# Patient Record
Sex: Male | Born: 1965
Health system: Southern US, Community
[De-identification: ages and names within clinical notes are randomized; demographics above are authoritative.]

## PROBLEM LIST (undated history)

## (undated) DIAGNOSIS — B009 Herpesviral infection, unspecified: Secondary | ICD-10-CM

## (undated) DIAGNOSIS — I10 Essential (primary) hypertension: Secondary | ICD-10-CM

## (undated) HISTORY — DX: Herpesviral infection, unspecified: B00.9

## (undated) HISTORY — PX: WISDOM TOOTH EXTRACTION: SHX21

---

## 1993-06-13 HISTORY — PX: LAPAROSCOPIC INGUINAL HERNIA REPAIR: SUR788

## 1993-06-13 HISTORY — PX: HERNIA REPAIR: SHX51

## 2010-02-11 ENCOUNTER — Encounter: Admission: RE | Admit: 2010-02-11 | Discharge: 2010-02-11 | Payer: Self-pay | Admitting: Internal Medicine

## 2011-11-15 ENCOUNTER — Emergency Department (HOSPITAL_COMMUNITY)
Admission: EM | Admit: 2011-11-15 | Discharge: 2011-11-15 | Disposition: A | Discharge status: Home / Self Care | Payer: Self-pay | Source: Home / Self Care | Attending: Emergency Medicine | Admitting: Emergency Medicine

## 2011-11-15 ENCOUNTER — Encounter (HOSPITAL_COMMUNITY): Payer: Self-pay | Admitting: Emergency Medicine

## 2011-11-15 DIAGNOSIS — L237 Allergic contact dermatitis due to plants, except food: Secondary | ICD-10-CM

## 2011-11-15 DIAGNOSIS — L255 Unspecified contact dermatitis due to plants, except food: Secondary | ICD-10-CM

## 2011-11-15 HISTORY — DX: Essential (primary) hypertension: I10

## 2011-11-15 MED ORDER — TRIAMCINOLONE ACETONIDE 0.1 % EX CREA: TOPICAL_CREAM | Freq: Three times a day (TID) | CUTANEOUS | Status: DC

## 2011-11-15 MED ORDER — PREDNISONE 10 MG PO TABS: ORAL_TABLET | ORAL | Status: DC

## 2011-11-15 MED ORDER — METHYLPREDNISOLONE ACETATE 80 MG/ML IJ SUSP
80.0000 mg | Freq: Once | INTRAMUSCULAR | Status: AC
  Administered 2011-11-15: 80 mg via INTRAMUSCULAR

## 2011-11-15 MED ORDER — METHYLPREDNISOLONE ACETATE 80 MG/ML IJ SUSP
INTRAMUSCULAR | Status: AC
  Filled 2011-11-15: qty 1

## 2011-11-15 NOTE — ED Notes (Signed)
Has had out break of poison oak/ivy the past week. Large area on left forearm. Smaller outbreaks on right arm and abdomen.

## 2011-11-15 NOTE — ED Provider Notes (Signed)
Chief Complaint  Patient presents with  . Poison Oak    History of Present Illness:   The patient is a 46 year old male who has had a seven-day history of a severe poison ivy outbreak and both of his arms and his trunk. 3 HTN blistered. He's had no fever or chills no difficulty breathing.  Review of Systems:  Other than noted above, the patient denies any of the following symptoms: Systemic:  No fever, chills, sweats, weight loss, or fatigue. ENT:  No nasal congestion, rhinorrhea, sore throat, swelling of lips, tongue or throat. Resp:  No cough, wheezing, or shortness of breath. Skin:  No rash, itching, nodules, or suspicious lesions.  PMFSH:  Past medical history, family history, social history, meds, and allergies were reviewed.  Physical Exam:   Vital signs:  BP 146/80  Pulse 68  Temp(Src) 97.9 F (36.6 C) (Oral)  Resp 16  SpO2 99% Gen:  Alert, oriented, in no distress. ENT:  Pharynx clear, no intraoral lesions, moist mucous membranes. Lungs:  Clear to auscultation. Skin:  He has severe blistering of both of his arms and on his trunk as well.  Course in Urgent Care Center:   He was given Depo-Medrol 80 mg IM.  Assessment:  The encounter diagnosis was Poison ivy.  Plan:   1.  The following meds were prescribed:   New Prescriptions   PREDNISONE (DELTASONE) 10 MG TABLET    Take 4 tabs daily for 4 days, 3 tabs daily for 4 days, 2 tabs daily for 4 days, then 1 tab daily for 4 days.   TRIAMCINOLONE CREAM (KENALOG) 0.1 %    Apply topically 3 (three) times daily.   2.  The patient was instructed in symptomatic care and handouts were given. 3.  The patient was told to return if becoming worse in any way, if no better in 3 or 4 days, and given some red flag symptoms that would indicate earlier return.     Reuben Likes, MD 11/15/11 [phone]

## 2011-11-15 NOTE — Discharge Instructions (Signed)
Use DomeBurrow solution soaks 4 times daily.Poison [employer] ivy is a inflammation of the skin (contact dermatitis) caused by touching the allergens on the leaves of the ivy plant following previous exposure to the plant. The rash usually appears 48 hours after exposure. The rash is usually bumps (papules) or blisters (vesicles) in a linear pattern. Depending on your own sensitivity, the rash may simply cause redness and itching, or it may also progress to blisters which may break open. These must be well cared for to prevent secondary bacterial (germ) infection, followed by scarring. Keep any open areas dry, clean, dressed, and covered with an antibacterial ointment if needed. The eyes may also get puffy. The puffiness is worst in the morning and gets better as the day progresses. This dermatitis usually heals without scarring, within 2 to 3 weeks without treatment. HOME CARE INSTRUCTIONS  Thoroughly wash with soap and water as soon as you have been exposed to poison ivy. You have about one half hour to remove the plant resin before it will cause the rash. This washing will destroy the oil or antigen on the skin that is causing, or will cause, the rash. Be sure to wash under your fingernails as any plant resin there will continue to spread the rash. Do not rub skin vigorously when washing affected area. Poison ivy cannot spread if no oil from the plant remains on your body. A rash that has progressed to weeping sores will not spread the rash unless you have not washed thoroughly. It is also important to wash any clothes you have been wearing as these may carry active allergens. The rash will return if you wear the unwashed clothing, even several days later. Avoidance of the plant in the future is the best measure. Poison ivy plant can be recognized by the number of leaves. Generally, poison ivy has three leaves with flowering branches on a single stem. Diphenhydramine may be purchased over the counter and  used as needed for itching. Do not drive with this medication if it makes you drowsy.Ask your caregiver about medication for children. SEEK MEDICAL CARE IF:  Open sores develop.   Redness spreads beyond area of rash.   You notice purulent (pus-like) discharge.   You have increased pain.   Other signs of infection develop (such as fever).  Document Released: 05/27/2000 Document Revised: 05/19/2011 Document Reviewed: 04/15/2009 [facility] Patient Information 2012 [place], [place].

## 2013-12-09 ENCOUNTER — Telehealth: Payer: Self-pay | Admitting: *Deleted

## 2013-12-09 NOTE — Telephone Encounter (Signed)
Left message on voice mail for the patient to return my call regarding upcoming appt.

## 2013-12-09 NOTE — Telephone Encounter (Signed)
Medication List and allergies: Reviewed and updated  90 Day supply/mail order:  Local prescriptions: [facility], 90 day  Immunizations Due: PNA  A/P FH, PSH, or Personal Hx: Reviewed and updated Flu vaccine: Tdap: 09/2011 PNA: Never Shingles: 09/2011 CCS: 04/2013 normal--> 5 years PSA: 11/2012 1.32  To Discuss with Provider: Wants to know if he needs to continue B/P med

## 2013-12-10 ENCOUNTER — Ambulatory Visit (HOSPITAL_BASED_OUTPATIENT_CLINIC_OR_DEPARTMENT_OTHER)
Admission: RE | Admit: 2013-12-10 | Discharge: 2013-12-10 | Disposition: A | Discharge status: Home / Self Care | Payer: BC Managed Care – PPO | Source: Ambulatory Visit | Attending: Internal Medicine | Admitting: Internal Medicine

## 2013-12-10 ENCOUNTER — Encounter: Payer: Self-pay | Admitting: Internal Medicine

## 2013-12-10 ENCOUNTER — Ambulatory Visit (INDEPENDENT_AMBULATORY_CARE_PROVIDER_SITE_OTHER): Payer: BC Managed Care – PPO | Admitting: Internal Medicine

## 2013-12-10 VITALS — BP 130/84 | HR 100 | Temp 98.2°F | Ht 72.0 in | Wt 185.0 lb

## 2013-12-10 DIAGNOSIS — E162 Hypoglycemia, unspecified: Secondary | ICD-10-CM

## 2013-12-10 DIAGNOSIS — I10 Essential (primary) hypertension: Secondary | ICD-10-CM

## 2013-12-10 DIAGNOSIS — M21612 Bunion of left foot: Secondary | ICD-10-CM

## 2013-12-10 DIAGNOSIS — R079 Chest pain, unspecified: Secondary | ICD-10-CM

## 2013-12-10 DIAGNOSIS — M21619 Bunion of unspecified foot: Secondary | ICD-10-CM | POA: Insufficient documentation

## 2013-12-10 DIAGNOSIS — R0781 Pleurodynia: Secondary | ICD-10-CM | POA: Insufficient documentation

## 2013-12-10 DIAGNOSIS — B009 Herpesviral infection, unspecified: Secondary | ICD-10-CM | POA: Insufficient documentation

## 2013-12-10 DIAGNOSIS — R5381 Other malaise: Secondary | ICD-10-CM

## 2013-12-10 DIAGNOSIS — R5383 Other fatigue: Secondary | ICD-10-CM | POA: Insufficient documentation

## 2013-12-10 DIAGNOSIS — R188 Other ascites: Secondary | ICD-10-CM | POA: Insufficient documentation

## 2013-12-10 LAB — COMPREHENSIVE METABOLIC PANEL
ALT: 47 U/L (ref 0–53)
AST: 37 U/L (ref 0–37)
Albumin: 4 g/dL (ref 3.5–5.2)
Alkaline Phosphatase: 45 U/L (ref 39–117)
BILIRUBIN TOTAL: 0.6 mg/dL (ref 0.2–1.2)
BUN: 13 mg/dL (ref 6–23)
CHLORIDE: 102 meq/L (ref 96–112)
CO2: 27 meq/L (ref 19–32)
Calcium: 9.3 mg/dL (ref 8.4–10.5)
Creatinine, Ser: 0.9 mg/dL (ref 0.4–1.5)
GFR: 100.32 mL/min (ref >60.00)
Glucose, Bld: 171 mg/dL — ABNORMAL HIGH (ref 70–99)
POTASSIUM: 3.4 meq/L — AB (ref 3.5–5.1)
SODIUM: 137 meq/L (ref 135–145)
TOTAL PROTEIN: 7.2 g/dL (ref 6.0–8.3)

## 2013-12-10 LAB — CBC WITH DIFFERENTIAL/PLATELET
BASOS ABS: 0 10*3/uL (ref 0.0–0.1)
Basophils Relative: 0.3 % (ref 0.0–3.0)
Eosinophils Absolute: 0.5 10*3/uL (ref 0.0–0.7)
Eosinophils Relative: 4.8 % (ref 0.0–5.0)
HEMATOCRIT: 44.5 % (ref 39.0–52.0)
Hemoglobin: 15.4 g/dL (ref 13.0–17.0)
LYMPHS ABS: 1.8 10*3/uL (ref 0.7–4.0)
Lymphocytes Relative: 16 % (ref 12.0–46.0)
MCHC: 34.7 g/dL (ref 30.0–36.0)
MCV: 91.8 fl (ref 78.0–100.0)
MONO ABS: 0.7 10*3/uL (ref 0.1–1.0)
MONOS PCT: 6.5 % (ref 3.0–12.0)
Neutro Abs: 8 10*3/uL — ABNORMAL HIGH (ref 1.4–7.7)
Neutrophils Relative %: 72.4 % (ref 43.0–77.0)
PLATELETS: 198 10*3/uL (ref 150.0–400.0)
RBC: 4.85 Mil/uL (ref 4.22–5.81)
RDW: 12.7 % (ref 11.5–15.5)
WBC: 11.1 10*3/uL — ABNORMAL HIGH (ref 4.0–10.5)

## 2013-12-10 LAB — VITAMIN B12: Vitamin B-12: 266 pg/mL (ref 211–911)

## 2013-12-10 LAB — TSH: TSH: 1.03 u[IU]/mL (ref 0.35–4.50)

## 2013-12-10 LAB — FOLATE: Folate: 14.6 ng/mL (ref >5.9)

## 2013-12-10 MED ORDER — AMLODIPINE BESYLATE 10 MG PO TABS: 10.0000 mg | ORAL_TABLET | Freq: Every day | ORAL | Status: DC

## 2013-12-10 MED ORDER — VALACYCLOVIR HCL 500 MG PO TABS: 500.0000 mg | ORAL_TABLET | Freq: Two times a day (BID) | ORAL | Status: DC | PRN

## 2013-12-10 MED ORDER — VALACYCLOVIR HCL 500 MG PO TABS
500.0000 mg | ORAL_TABLET | Freq: Two times a day (BID) | ORAL | Status: DC | PRN
Start: 2013-12-10 — End: 2013-12-10

## 2013-12-10 NOTE — Progress Notes (Signed)
Pre visit review using our clinic review tool, if applicable. No additional management support is needed unless otherwise documented below in the visit note. 

## 2013-12-10 NOTE — Patient Instructions (Signed)
Get your blood work before you leave   SIGN A RELEASE OF INFORMATION TO OBTAIN RECORDS FROM YOUR PREVIOUS DOCTOR (@ THE FRONT DESK)  Get the XR at [facility], corner of [place] and [address] (10 minutes form here); they are open 24/7 [address]  [place], [place] [address] [phone]  Next visit is for a physical exam in 6 months   fasting Please make an appointment     At some point this year the clinic will relocate to  [facility],  corner of [place] and [address] (10 minutes form here)  [place]  [place], [place] [address] [phone]

## 2013-12-10 NOTE — Assessment & Plan Note (Signed)
3 months history of pain at the distal left rib cage, pain sounds mechanical, he does have an asymmetric rib cage. Plan: Chest x-ray Reassess on RTC

## 2013-12-10 NOTE — Assessment & Plan Note (Signed)
Good compliance of medication, BP today is normal. Plan: No change, labs,Get records from previous M.D.

## 2013-12-10 NOTE — Progress Notes (Signed)
Subjective:    Patient ID: Larry Burgess, male    DOB: 1966/01/18, 48 y.o.   MRN: [id number]  DOS:  12/10/2013 Type of  Visit: new patient , moved  from [place],  [place] 3 years ago,  used to see the doctors over there. Needs a new PCP History, Needs to discuss several issues -- several months history of lack of energy, described as generalized fatigue, feeling unmotivated. -- 3 months history of pain at the distal left rib cage, the pain is on and off, described as a soreness, not sharp, increase depending on his posture, quickly corrects if he changed his position. --Also complained of pain at the left second toe, has noted some deformities there.  ROS Denies fever, chills. No weight loss No chest pain, dyspnea on exertion, palpitations. Denies nausea, vomiting, diarrhea or blood in the stools. Very rarely has GERD symptoms. No depression or anxiety. + Snoring but not really feeling sleepy throughout the day. Denies problems with decreased libido or ED  Past Medical History  Diagnosis Date  . Hypertension   . Herpes     Past Surgical History  Procedure Laterality Date  . Hernia repair  1995    History   Social History  . Marital Status: Married    Spouse Name: N/A    Number of Children: 0  . Years of Education: N/A   Occupational History  . engineer     Social History Main Topics  . Smoking status: Never Smoker   . Smokeless tobacco: Not on file  . Alcohol Use: Yes     Comment: occasionally  . Drug Use:   . Sexual Activity:    Other Topics Concern  . Not on file   Social History Narrative  . No narrative on file     Family History  Problem Relation Age of Onset  . CAD Neg Hx   . Stroke Neg Hx   . Colon cancer Neg Hx   . Prostate cancer Neg Hx        Medication List       This list is accurate as of: 12/10/13  6:17 PM.  Always use your most recent med list.               amLODipine 10 MG tablet  Commonly known as:  NORVASC  Take 1  tablet (10 mg total) by mouth daily.     valACYclovir 500 MG tablet  Commonly known as:  VALTREX  Take 1 tablet (500 mg total) by mouth 2 (two) times daily as needed.           Objective:   Physical Exam BP 130/84  Pulse 100  Temp(Src) 98.2 F (36.8 C)  Ht 6' (1.829 m)  Wt 185 lb (83.915 kg)  BMI 25.08 kg/m2  SpO2 99%  General -- alert, well-developed, healthy appearing, NAD.  Neck --no thyromegaly   HEENT-- Not pale. Lungs -- normal respiratory effort, no intercostal retractions, no accessory muscle use, and normal breath sounds.  Heart-- normal rate, regular rhythm, no murmur.  Chest wall, rib cage not tender to palpation, the left side is more prominent, patient reports he has been that way since he can't recall. Abdomen-- Not distended, good bowel sounds,soft, non-tender. No HSM Extremities-- no pretibial edema bilaterally ; Left foot : + bunion, second toe bending medially Neurologic--  alert & oriented X3. Speech normal, gait appropriate for age, strength symmetric and appropriate for age.  Psych-- Cognition and judgment  appear intact. Cooperative with normal attention span and concentration. No anxious or depressed appearing.       Assessment & Plan:   Today , I spent 34    min with the patient: >50% of the time counseling regards multiple issues, multiple questions about every  issue answered to  the best of my ability       F2FWith the patient more than 50 minutes discussing multiple issues

## 2013-12-10 NOTE — Assessment & Plan Note (Signed)
Recurrent genital herpes on Valtrex as needed, prescription sent

## 2013-12-10 NOTE — Assessment & Plan Note (Signed)
Concerned about the deformity of the second left toe and bunion.  Does not like to see a foot doctor or orthopedic doctor as he wouldn't desire any surgery.  Recommend observation for now, if he desires a  noninvasive treatment he will call for a sports medicine referral, special shoes may be needed at some point

## 2013-12-10 NOTE — Assessment & Plan Note (Signed)
Lack of energy for several months, physical exam is normal, review of systems is negative for depression or other symptoms. Plan: We'll start with basic labs, reassess in 6 months, he does snore, consider a sleep study

## 2013-12-11 ENCOUNTER — Other Ambulatory Visit (INDEPENDENT_AMBULATORY_CARE_PROVIDER_SITE_OTHER): Payer: BC Managed Care – PPO

## 2013-12-11 ENCOUNTER — Encounter: Payer: Self-pay | Admitting: *Deleted

## 2013-12-11 ENCOUNTER — Telehealth: Payer: Self-pay | Admitting: Internal Medicine

## 2013-12-11 DIAGNOSIS — E162 Hypoglycemia, unspecified: Secondary | ICD-10-CM

## 2013-12-11 LAB — HEMOGLOBIN A1C: HEMOGLOBIN A1C: 5.4 % (ref 4.6–6.5)

## 2013-12-11 NOTE — Addendum Note (Signed)
Addended by: Peggyann Shoals on: 12/11/2013 08:21 AM   Modules accepted: Orders

## 2013-12-11 NOTE — Telephone Encounter (Signed)
Relevant patient education mailed to patient.  

## 2014-02-16 ENCOUNTER — Telehealth: Payer: Self-pay | Admitting: Internal Medicine

## 2014-02-16 NOTE — Telephone Encounter (Signed)
A number of handwritten records reviewed from previous PCP. At some point he was on Zestoretic, several years ago was switch to Norvasc. In 2013 she complained of "tongue swelling", at that time he was not taking ACEi. History of high triglycerides, at some point more than 500. 2013, labs: Potassium 4.2, creatinine 0.79 Total cholesterol 184, triglycerides 247, LDL 92 Only a few relevant records will be scan

## 2014-03-05 ENCOUNTER — Telehealth: Payer: Self-pay | Admitting: Internal Medicine

## 2014-03-05 MED ORDER — VALACYCLOVIR HCL 500 MG PO TABS: 500.0000 mg | ORAL_TABLET | Freq: Two times a day (BID) | ORAL | Status: DC | PRN

## 2014-03-05 MED ORDER — AMLODIPINE BESYLATE 10 MG PO TABS: 10.0000 mg | ORAL_TABLET | Freq: Every day | ORAL | Status: DC

## 2014-03-05 NOTE — Telephone Encounter (Signed)
[facility], [facility] him that they faxed the form to dr. Larose Kells but have not yet heard anything, pt needs new rx amLODipine (NORVASC) 10 MG tablet and valacyclovir (valtrex) 500 mg [facility].

## 2014-03-05 NOTE — Telephone Encounter (Signed)
Amlodipine and Valtrex filled to [facility].

## 2014-03-06 NOTE — Telephone Encounter (Signed)
Error/gd °

## 2014-06-11 ENCOUNTER — Ambulatory Visit (INDEPENDENT_AMBULATORY_CARE_PROVIDER_SITE_OTHER): Payer: BC Managed Care – PPO | Admitting: Internal Medicine

## 2014-06-11 ENCOUNTER — Encounter: Payer: Self-pay | Admitting: Internal Medicine

## 2014-06-11 VITALS — BP 138/88 | HR 73 | Temp 98.1°F | Ht 72.0 in | Wt 186.5 lb

## 2014-06-11 DIAGNOSIS — B009 Herpesviral infection, unspecified: Secondary | ICD-10-CM

## 2014-06-11 DIAGNOSIS — Z0000 Encounter for general adult medical examination without abnormal findings: Secondary | ICD-10-CM | POA: Insufficient documentation

## 2014-06-11 LAB — LIPID PANEL
Cholesterol: 179 mg/dL (ref 0–200)
HDL: 40 mg/dL (ref >39.00)
LDL Cholesterol: 105 mg/dL — ABNORMAL HIGH (ref 0–99)
NONHDL: 139
Total CHOL/HDL Ratio: 4
Triglycerides: 172 mg/dL — ABNORMAL HIGH (ref 0.0–149.0)
VLDL: 34.4 mg/dL (ref 0.0–40.0)

## 2014-06-11 LAB — BASIC METABOLIC PANEL
BUN: 8 mg/dL (ref 6–23)
CALCIUM: 9 mg/dL (ref 8.4–10.5)
CHLORIDE: 105 meq/L (ref 96–112)
CO2: 30 meq/L (ref 19–32)
CREATININE: 0.9 mg/dL (ref 0.4–1.5)
GFR: 100.11 mL/min (ref >60.00)
Glucose, Bld: 87 mg/dL (ref 70–99)
Potassium: 4.1 mEq/L (ref 3.5–5.1)
SODIUM: 138 meq/L (ref 135–145)

## 2014-06-11 LAB — CBC WITH DIFFERENTIAL/PLATELET
BASOS PCT: 0.6 % (ref 0.0–3.0)
Basophils Absolute: 0 10*3/uL (ref 0.0–0.1)
Eosinophils Absolute: 0.4 10*3/uL (ref 0.0–0.7)
Eosinophils Relative: 7.3 % — ABNORMAL HIGH (ref 0.0–5.0)
HEMATOCRIT: 45.9 % (ref 39.0–52.0)
Hemoglobin: 15.4 g/dL (ref 13.0–17.0)
Lymphocytes Relative: 27.2 % (ref 12.0–46.0)
Lymphs Abs: 1.6 10*3/uL (ref 0.7–4.0)
MCHC: 33.6 g/dL (ref 30.0–36.0)
MCV: 93.3 fl (ref 78.0–100.0)
MONOS PCT: 8.2 % (ref 3.0–12.0)
Monocytes Absolute: 0.5 10*3/uL (ref 0.1–1.0)
NEUTROS ABS: 3.2 10*3/uL (ref 1.4–7.7)
NEUTROS PCT: 56.7 % (ref 43.0–77.0)
Platelets: 182 10*3/uL (ref 150.0–400.0)
RBC: 4.92 Mil/uL (ref 4.22–5.81)
RDW: 12.3 % (ref 11.5–15.5)
WBC: 5.7 10*3/uL (ref 4.0–10.5)

## 2014-06-11 MED ORDER — VALACYCLOVIR HCL 500 MG PO TABS: 500.0000 mg | ORAL_TABLET | Freq: Two times a day (BID) | ORAL | Status: DC | PRN

## 2014-06-11 MED ORDER — AMLODIPINE BESYLATE 10 MG PO TABS: 10.0000 mg | ORAL_TABLET | Freq: Every day | ORAL | Status: DC

## 2014-06-11 NOTE — Assessment & Plan Note (Signed)
Currently on Valtrex 1 tab  daily

## 2014-06-11 NOTE — Patient Instructions (Signed)
Get your blood work before you leave   Check the  blood pressure 2   times a month   Be sure your blood pressure is between  145/85  and 110/65.  if it is consistently higher or lower, let me know   Please come back to the office in 1 year  for a physical exam. Come back fasting

## 2014-06-11 NOTE — Progress Notes (Signed)
Pre visit review using our clinic review tool, if applicable. No additional management support is needed unless otherwise documented below in the visit note. 

## 2014-06-11 NOTE — Assessment & Plan Note (Addendum)
Td 2010 per pt Declined a flu shot Never had a cscope  Diet-exercise discussed Labs  EKG-- nsr

## 2014-06-11 NOTE — Progress Notes (Signed)
   Subjective:    Patient ID: Council Munguia, male    DOB: [birth year], 48 y.o.   MRN: [id number]  DOS:  06/11/2014 Type of visit - description : cpx Interval history: no concerns    ROS Denies CP-SOB occ has episodes of nausea (random), no vomiting, diarrhea, blood in the stools or abd pain Denies anxiety or depression. No dysuria, gross hematuria or difficulty urinating. Urinary stream is slightly "weaker" compared 2 years ago.   Past Medical History  Diagnosis Date  . Hypertension   . Herpes     Past Surgical History  Procedure Laterality Date  . Hernia repair  1995    History   Social History  . Marital Status: Married    Spouse Name: N/A    Number of Children: 0  . Years of Education: N/A   Occupational History  . engineer     Social History Main Topics  . Smoking status: Never Smoker   . Smokeless tobacco: Never Used  . Alcohol Use: 0.0 oz/week    0 Not specified per week     Comment: occasionally  . Drug Use: Not on file  . Sexual Activity: Not on file   Other Topics Concern  . Not on file   Social History Narrative   Original from [place], moved to [place] at age 40   Household- pt and wife     Family History  Problem Relation Age of Onset  . CAD Neg Hx   . Stroke Neg Hx   . Colon cancer Neg Hx   . Prostate cancer Neg Hx        Medication List       This list is accurate as of: 06/11/14 11:59 PM.  Always use your most recent med list.               amLODipine 10 MG tablet  Commonly known as:  NORVASC  Take 1 tablet (10 mg total) by mouth daily.     valACYclovir 500 MG tablet  Commonly known as:  VALTREX  Take 1 tablet (500 mg total) by mouth 2 (two) times daily as needed.           Objective:   Physical Exam BP 138/88 mmHg  Pulse 73  Temp(Src) 98.1 F (36.7 C) (Oral)  Ht 6' (1.829 m)  Wt 186 lb 8 oz (84.596 kg)  BMI 25.29 kg/m2  SpO2 97%  General -- alert, well-developed, NAD.  Neck --no thyromegaly   HEENT-- Not pale    Lungs -- normal respiratory effort, no intercostal retractions, no accessory muscle use, and normal breath sounds.  Heart-- normal rate, regular rhythm, no murmur.  Abdomen-- Not distended, good bowel sounds,soft, non-tender. Extremities-- no pretibial edema bilaterally  Neurologic--  alert & oriented X3. Speech normal, gait appropriate for age, strength symmetric and appropriate for age.  Psych-- Cognition and judgment appear intact. Cooperative with normal attention span and concentration. No anxious or depressed appearing.      Assessment & Plan:

## 2015-04-07 ENCOUNTER — Telehealth: Payer: Self-pay | Admitting: *Deleted

## 2015-04-07 NOTE — Telephone Encounter (Signed)
Received order for compression socks via fax from [facility]. Forwarded to Dr. Larose Kells. JG//CMA

## 2015-06-14 HISTORY — PX: COLONOSCOPY: SHX174

## 2015-06-16 ENCOUNTER — Telehealth: Payer: Self-pay

## 2015-06-16 DIAGNOSIS — Z1211 Encounter for screening for malignant neoplasm of colon: Secondary | ICD-10-CM

## 2015-06-16 NOTE — Telephone Encounter (Signed)
Medication: Review, verify sig & reconcile(including outside meds): Completed Duplicates discarded: n/a DM supply source: n/a   Preferred Pharmacy and which med where: 90 day supply/mail order:  [place] [place] [facility]: [id number] [place], [place]  Allergies verified: Completed. No changes.   Immunization Status: Prompted for insurance verification: Yes Flu vaccine--Declined.  Postponed.   Tdap--2010 per patient.  Updated under Historical Immunizations.    A/P:   Changes to FH, PSH or Personal Hx:  Reviewed.  No updates.   CCS: Never had a colonoscopy; open to scheduling.  GI referral---pending  PSA: Not noted in chart HIV Screening-----DUE   Care Teams Updated:  No changes.   ED/Hospital/Urgent Care Visits: N/a Prompted for: Updated insurance, contact information, forms:  Yes; states insurance is the same.   Remind to bring: DPR information, advance directives: Reviewed.    To Discuss with Provider:  Mentioned back pain, but states not sure if there is anything Dr. Larose Kells can do about that.

## 2015-06-17 ENCOUNTER — Encounter: Payer: Self-pay | Admitting: Internal Medicine

## 2015-06-17 ENCOUNTER — Ambulatory Visit (INDEPENDENT_AMBULATORY_CARE_PROVIDER_SITE_OTHER): Payer: BLUE CROSS/BLUE SHIELD | Admitting: Internal Medicine

## 2015-06-17 VITALS — BP 122/78 | HR 73 | Temp 97.8°F | Ht 72.0 in | Wt 189.4 lb

## 2015-06-17 DIAGNOSIS — R6882 Decreased libido: Secondary | ICD-10-CM

## 2015-06-17 DIAGNOSIS — Z114 Encounter for screening for human immunodeficiency virus [HIV]: Secondary | ICD-10-CM | POA: Diagnosis not present

## 2015-06-17 DIAGNOSIS — Z1211 Encounter for screening for malignant neoplasm of colon: Secondary | ICD-10-CM

## 2015-06-17 DIAGNOSIS — Z0000 Encounter for general adult medical examination without abnormal findings: Secondary | ICD-10-CM | POA: Diagnosis not present

## 2015-06-17 DIAGNOSIS — R5383 Other fatigue: Secondary | ICD-10-CM | POA: Diagnosis not present

## 2015-06-17 DIAGNOSIS — Z125 Encounter for screening for malignant neoplasm of prostate: Secondary | ICD-10-CM | POA: Diagnosis not present

## 2015-06-17 LAB — CBC WITH DIFFERENTIAL/PLATELET
BASOS PCT: 0.5 % (ref 0.0–3.0)
Basophils Absolute: 0 10*3/uL (ref 0.0–0.1)
EOS PCT: 8.8 % — AB (ref 0.0–5.0)
Eosinophils Absolute: 0.6 10*3/uL (ref 0.0–0.7)
HCT: 48.4 % (ref 39.0–52.0)
HEMOGLOBIN: 16.4 g/dL (ref 13.0–17.0)
LYMPHS ABS: 1.7 10*3/uL (ref 0.7–4.0)
Lymphocytes Relative: 25.7 % (ref 12.0–46.0)
MCHC: 33.9 g/dL (ref 30.0–36.0)
MCV: 93.3 fl (ref 78.0–100.0)
MONOS PCT: 9.4 % (ref 3.0–12.0)
Monocytes Absolute: 0.6 10*3/uL (ref 0.1–1.0)
NEUTROS PCT: 55.6 % (ref 43.0–77.0)
Neutro Abs: 3.7 10*3/uL (ref 1.4–7.7)
Platelets: 196 10*3/uL (ref 150.0–400.0)
RBC: 5.19 Mil/uL (ref 4.22–5.81)
RDW: 12.6 % (ref 11.5–15.5)
WBC: 6.7 10*3/uL (ref 4.0–10.5)

## 2015-06-17 LAB — COMPREHENSIVE METABOLIC PANEL
ALBUMIN: 4.4 g/dL (ref 3.5–5.2)
ALK PHOS: 52 U/L (ref 39–117)
ALT: 32 U/L (ref 0–53)
AST: 26 U/L (ref 0–37)
BILIRUBIN TOTAL: 0.5 mg/dL (ref 0.2–1.2)
BUN: 10 mg/dL (ref 6–23)
CALCIUM: 9.8 mg/dL (ref 8.4–10.5)
CO2: 27 meq/L (ref 19–32)
CREATININE: 0.85 mg/dL (ref 0.40–1.50)
Chloride: 104 mEq/L (ref 96–112)
GFR: 101.06 mL/min (ref >60.00)
Glucose, Bld: 92 mg/dL (ref 70–99)
Potassium: 4.5 mEq/L (ref 3.5–5.1)
Sodium: 141 mEq/L (ref 135–145)
TOTAL PROTEIN: 6.9 g/dL (ref 6.0–8.3)

## 2015-06-17 LAB — LIPID PANEL
Cholesterol: 203 mg/dL — ABNORMAL HIGH (ref 0–200)
HDL: 41.9 mg/dL (ref >39.00)
NONHDL: 160.7
TRIGLYCERIDES: 247 mg/dL — AB (ref 0.0–149.0)
Total CHOL/HDL Ratio: 5
VLDL: 49.4 mg/dL — ABNORMAL HIGH (ref 0.0–40.0)

## 2015-06-17 LAB — FOLATE: FOLATE: 13.3 ng/mL (ref >5.9)

## 2015-06-17 LAB — TSH: TSH: 3.97 u[IU]/mL (ref 0.35–4.50)

## 2015-06-17 LAB — PSA: PSA: 0.98 ng/mL (ref 0.10–4.00)

## 2015-06-17 LAB — LDL CHOLESTEROL, DIRECT: Direct LDL: 112 mg/dL

## 2015-06-17 LAB — VITAMIN B12: VITAMIN B 12: 293 pg/mL (ref 211–911)

## 2015-06-17 NOTE — Progress Notes (Signed)
Subjective:    Patient ID: Larry Burgess, male    DOB: 04-Dec-1965, 50 y.o.   MRN: [id number]  DOS:  06/17/2015 Type of visit - description : CPX Interval history: Good compliance with medications   Review of Systems Constitutional:  No fever. No chills. No unexplained wt changes. No unusual sweats. Patient reports that his wife has noted occasional decrease in  energy level. Patient denies feeling fatigue per se, he did note some lack of motivation from time to time. On further questioning he does snore and occasionally feels sleepy. No anxiety or depression that he can tell. For the last 2 years his libido has been slightly low  HEENT: No dental problems, no ear discharge, no facial swelling, no voice changes. No eye discharge, no eye  redness , no  intolerance to light   Respiratory: No wheezing , no  difficulty breathing. No cough , no mucus production  Cardiovascular: No CP, no leg swelling , no  Palpitations  GI: no nausea, no vomiting, no diarrhea , no  abdominal pain.  No blood in the stools. No dysphagia, no odynophagia    Endocrine: No polyphagia, no polyuria , no polydipsia  GU: No dysuria, gross hematuria, difficulty urinating. No urinary urgency, no frequency.  Musculoskeletal: No joint swellings or unusual aches or pains. Chronic (more than 1 year history) of mild right-sided neck pain radiating to the trapezoid area, usually at the end of the day. Better after he wakes up.  Skin: No change in the color of the skin, palor , no  Rash  Allergic, immunologic: No environmental allergies , no  food allergies  Neurological: No dizziness no  syncope. No headaches. No diplopia, no slurred, no slurred speech, no motor deficits, no facial  Numbness  Hematological: No enlarged lymph nodes, no easy bruising , no unusual bleedings  Psychiatry: No suicidal ideas, no hallucinations, no beavior problems, no confusion.     Past Medical History  Diagnosis Date  .  Hypertension   . Herpes     Past Surgical History  Procedure Laterality Date  . Hernia repair  1995    Social History   Social History  . Marital Status: Married    Spouse Name: N/A  . Number of Children: 0  . Years of Education: N/A   Occupational History  . engineer     Social History Main Topics  . Smoking status: Never Smoker   . Smokeless tobacco: Never Used  . Alcohol Use: 0.0 oz/week    0 Standard drinks or equivalent per week     Comment: occasionally  . Drug Use: Not on file  . Sexual Activity: Yes   Other Topics Concern  . Not on file   Social History Narrative   Original from [place], moved to [place] at age 21   Household- pt and wife     Family History  Problem Relation Age of Onset  . CAD Neg Hx   . Stroke Neg Hx   . Colon cancer Neg Hx   . Prostate cancer Neg Hx   . Diabetes Brother        Medication List       This list is accurate as of: 06/17/15  6:06 PM.  Always use your most recent med list.               amLODipine 10 MG tablet  Commonly known as:  NORVASC  Take 1 tablet (10 mg total) by mouth daily.  valACYclovir 500 MG tablet  Commonly known as:  VALTREX  Take 1 tablet (500 mg total) by mouth 2 (two) times daily as needed.           Objective:   Physical Exam BP 122/78 mmHg  Pulse 73  Temp(Src) 97.8 F (36.6 C) (Oral)  Ht 6' (1.829 m)  Wt 189 lb 6 oz (85.9 kg)  BMI 25.68 kg/m2  SpO2 98% General:   Well developed, well nourished . NAD.  Neck:  Full range of motion. Supple. No  Thyromegaly  HEENT:  Normocephalic . Face symmetric, atraumatic Lungs:  CTA B Normal respiratory effort, no intercostal retractions, no accessory muscle use. Heart: RRR,  no murmur.  No pretibial edema bilaterally  Abdomen:  Not distended, soft, non-tender. No rebound or rigidity.  Rectal:  External abnormalities: none. Normal sphincter tone. No rectal masses or tenderness.  Stool brown  Prostate: Prostate gland firm and smooth, no  enlargement, nodularity, tenderness, mass, asymmetry or induration.  Skin: Exposed areas without rash. Not pale. Not jaundice Neurologic:  alert & oriented X3.  Speech normal, gait appropriate for age and unassisted Strength symmetric and appropriate for age.  Psych: Cognition and judgment appear intact.  Cooperative with normal attention span and concentration.  Behavior appropriate. No anxious or depressed appearing.    Assessment & Plan:   Assessment HTN HSV  PLAN: HTN: Continue amlodipine, recommend ambulatory BPs to be sure if BP is not over controlled Fatigue: As described above, unclear etiology. Epworth scored ~6 which is negative. Plan: [id number], vitamin D, folic acid, testosterone.  Reassess in 6 months Neck pain: Recommend stretching (exercises discussed) and watch posture. Declined PT referral RTC 6 months

## 2015-06-17 NOTE — Assessment & Plan Note (Addendum)
Td 2010 per pt Declined a flu shot Colon cancer screening: Discussed colonoscopy versus other modalities. Refer to GI Diet-exercise discussed Labs : FLP, CMP, CBC, TSH, PSA, HIV

## 2015-06-17 NOTE — Progress Notes (Signed)
Pre visit review using our clinic review tool, if applicable. No additional management support is needed unless otherwise documented below in the visit note. 

## 2015-06-17 NOTE — Patient Instructions (Addendum)
BEFORE YOU LEAVE THE OFFICE:  GO TO THE LAB  Get the blood work    GO TO THE FRONT DESK Schedule a routine office visit or check up to be done in  6 months  No  fasting  We can als   AFTER YOU LEAVE THE OFFICE:    Check the  blood pressure 2 or 3 times a month   Be sure your blood pressure is between 110/65 and  145/85. If it is consistently higher or lower, let me know

## 2015-06-17 NOTE — Telephone Encounter (Signed)
At check out pt checked the status of his order. Showing message was sent to PCP, however not showing that provider received request.    Please advise further.    CB: [phone]

## 2015-06-18 ENCOUNTER — Encounter: Payer: Self-pay | Admitting: Internal Medicine

## 2015-06-18 LAB — TESTOSTERONE, FREE, TOTAL, SHBG
Sex Hormone Binding: 26 nmol/L (ref 10–50)
TESTOSTERONE: 348 ng/dL (ref 300–890)
Testosterone, Free: 79.4 pg/mL (ref 47.0–244.0)
Testosterone-% Free: 2.3 % (ref 1.6–2.9)

## 2015-06-18 LAB — HIV ANTIBODY (ROUTINE TESTING W REFLEX): HIV 1&2 Ab, 4th Generation: NONREACTIVE

## 2015-06-21 LAB — VITAMIN D 1,25 DIHYDROXY
Vitamin D 1, 25 (OH)2 Total: 47 pg/mL (ref 18–72)
Vitamin D2 1, 25 (OH)2: <8 pg/mL
Vitamin D3 1, 25 (OH)2: 47 pg/mL

## 2015-08-11 ENCOUNTER — Ambulatory Visit (AMBULATORY_SURGERY_CENTER): Payer: Self-pay | Admitting: *Deleted

## 2015-08-11 VITALS — Ht 72.0 in | Wt 186.2 lb

## 2015-08-11 DIAGNOSIS — Z1211 Encounter for screening for malignant neoplasm of colon: Secondary | ICD-10-CM

## 2015-08-11 MED ORDER — NA SULFATE-K SULFATE-MG SULF 17.5-3.13-1.6 GM/177ML PO SOLN: 1.0000 | Freq: Once | ORAL | Status: DC

## 2015-08-11 NOTE — Progress Notes (Signed)
No egg or soy allergy  No anesthesia or intubation problems per pt  No diet medications taken  Pt states "why do I have to have a care partner stay the entire procedure?"  I explained it was for safety reasons and he states, "will it was really inconvenient that I had to stay for my mother's dental surgery- I had things I needed to do."  I again explained for safety reasons and it was just he policy of the [facility].  He states, "fine, my wife will just have to stay then."

## 2015-08-17 ENCOUNTER — Ambulatory Visit (AMBULATORY_SURGERY_CENTER): Payer: BLUE CROSS/BLUE SHIELD | Admitting: Internal Medicine

## 2015-08-17 ENCOUNTER — Encounter: Payer: Self-pay | Admitting: Internal Medicine

## 2015-08-17 VITALS — BP 128/80 | HR 80 | Temp 98.0°F | Resp 12 | Ht 72.0 in | Wt 186.0 lb

## 2015-08-17 DIAGNOSIS — D125 Benign neoplasm of sigmoid colon: Secondary | ICD-10-CM

## 2015-08-17 DIAGNOSIS — D122 Benign neoplasm of ascending colon: Secondary | ICD-10-CM | POA: Diagnosis not present

## 2015-08-17 DIAGNOSIS — Z1211 Encounter for screening for malignant neoplasm of colon: Secondary | ICD-10-CM | POA: Diagnosis not present

## 2015-08-17 MED ORDER — SODIUM CHLORIDE 0.9 % IV SOLN: 500.0000 mL | INTRAVENOUS | Status: DC

## 2015-08-17 NOTE — Patient Instructions (Signed)
YOU HAD AN ENDOSCOPIC PROCEDURE TODAY AT THE [facility]:   Refer to the procedure report that was given to you for any specific questions about what was found during the examination.  If the procedure report does not answer your questions, please call your gastroenterologist to clarify.  If you requested that your care partner not be given the details of your procedure findings, then the procedure report has been included in a sealed envelope for you to review at your convenience later.  YOU SHOULD EXPECT: Some feelings of bloating in the abdomen. Passage of more gas than usual.  Walking can help get rid of the air that was put into your GI tract during the procedure and reduce the bloating. If you had a lower endoscopy (such as a colonoscopy or flexible sigmoidoscopy) you may notice spotting of blood in your stool or on the toilet paper. If you underwent a bowel prep for your procedure, you may not have a normal bowel movement for a few days.  Please Note:  You might notice some irritation and congestion in your nose or some drainage.  This is from the oxygen used during your procedure.  There is no need for concern and it should clear up in a day or so.  SYMPTOMS TO REPORT IMMEDIATELY:   Following lower endoscopy (colonoscopy or flexible sigmoidoscopy):  Excessive amounts of blood in the stool  Significant tenderness or worsening of abdominal pains  Swelling of the abdomen that is new, acute  Fever of 100F or higher  For urgent or emergent issues, a gastroenterologist can be reached at any hour by calling [phone].  DIET: Your first meal following the procedure should be a small meal and then it is ok to progress to your normal diet. Heavy or fried foods are harder to digest and may make you feel nauseous or bloated.  Likewise, meals heavy in dairy and vegetables can increase bloating.  Drink plenty of fluids but you should avoid alcoholic beverages for 24 hours.  ACTIVITY:   You should plan to take it easy for the rest of today and you should NOT DRIVE or use heavy machinery until tomorrow (because of the sedation medicines used during the test).    FOLLOW UP: Our staff will call the number listed on your records the next business day following your procedure to check on you and address any questions or concerns that you may have regarding the information given to you following your procedure. If we do not reach you, we will leave a message.  However, if you are feeling well and you are not experiencing any problems, there is no need to return our call.  We will assume that you have returned to your regular daily activities without incident.  If any biopsies were taken you will be contacted by phone or by letter within the next 1-3 weeks.  Please call us at [phone] if you have not heard about the biopsies in 3 weeks.   SIGNATURES/CONFIDENTIALITY: You and/or your care partner have signed paperwork which will be entered into your electronic medical record.  These signatures attest to the fact that that the information above on your After Visit Summary has been reviewed and is understood.  Full responsibility of the confidentiality of this discharge information lies with you and/or your care-partner.  Please continue your normal medications  Please read over handout about polyps 

## 2015-08-17 NOTE — Op Note (Signed)
[facility]  [address] [place], [address]   COLONOSCOPY PROCEDURE REPORT  PATIENT: Larry, Burgess  MR#: [id number] BIRTHDATE: 11/02/1965 , 51  yrs. old GENDER: male ENDOSCOPIST: Jerene Bears, MD REFERRED [id number] Larose Kells, M.D. PROCEDURE DATE:  08/17/2015 PROCEDURE:   Colonoscopy, screening and Colonoscopy with snare polypectomy First Screening Colonoscopy - Avg.  risk and is 50 yrs.  old or older Yes.  Prior Negative Screening - Now for repeat screening. N/A  History of Adenoma - Now for follow-up colonoscopy & has been > or = to 3 yrs.  N/A  Polyps removed today? Yes ASA CLASS:   Class II INDICATIONS:Screening for colonic neoplasia, Colorectal Neoplasm Risk Assessment for this procedure is average risk, and 1st colonoscopy. MEDICATIONS: Monitored anesthesia care and Propofol 360 mg IV  DESCRIPTION OF PROCEDURE:   After the risks benefits and alternatives of the procedure were thoroughly explained, informed consent was obtained.  The digital rectal exam revealed no rectal mass.   The LB [id number]  endoscope was introduced through the anus and advanced to the cecum, which was identified by both the appendix and ileocecal valve. No adverse events experienced. The quality of the prep was good.  (Suprep was used)  The instrument was then slowly withdrawn as the colon was fully examined. Estimated blood loss is zero unless otherwise noted in this procedure report.  Photos taken, but images not obtained due to software malfunction     COLON FINDINGS: A sessile polyp measuring 3 mm in size was found in the ascending colon.  A polypectomy was performed with a cold snare.  The resection was complete, the polyp tissue was completely retrieved and sent to histology.   Two sessile polyps ranging between 3-82mm in size were found in the sigmoid colon. Polypectomies were performed with a cold snare.  The resection was complete, the polyp tissue was completely  retrieved and sent to histology.   The examination was otherwise normal.  Retroflexed views revealed internal hemorrhoids. The time to cecum = 3.2 Withdrawal time = 11.9   The scope was withdrawn and the procedure completed.   COMPLICATIONS: There were no immediate complications.  ENDOSCOPIC IMPRESSION: 1.   Sessile polyp was found in the ascending colon; polypectomy was performed with a cold snare 2.   Two sessile polyps ranging between 3-54mm in size were found in the sigmoid colon; polypectomies were performed with a cold snare 3.   The examination was otherwise normal  RECOMMENDATIONS: 1.  Await pathology results 2.  Timing of repeat colonoscopy will be determined by pathology findings. 3.  You will receive a letter within 1-2 weeks with the results of your biopsy as well as final recommendations.  Please call my office if you have not received a letter after 3 weeks.  eSigned:  Jerene Bears, MD 08/17/2015 8:27 AM   cc:  the patient, Dr. Larose Kells

## 2015-08-17 NOTE — Progress Notes (Signed)
To pacu vss patent aw report to rn 

## 2015-08-17 NOTE — Progress Notes (Signed)
Called to room to assist during endoscopic procedure.  Patient ID and intended procedure confirmed with present staff. Received instructions for my participation in the procedure from the performing physician.  

## 2015-08-18 ENCOUNTER — Telehealth: Payer: Self-pay | Admitting: *Deleted

## 2015-08-18 NOTE — Telephone Encounter (Signed)
  Follow up Call-  Call back number 08/17/2015  Post procedure Call Back phone  # [phone]  Permission to leave phone message Yes     Patient questions:  Do you have a fever, pain , or abdominal swelling? No. Pain Score  0 *  Have you tolerated food without any problems? Yes.    Have you been able to return to your normal activities? Yes.    Do you have any questions about your discharge instructions: Diet   No. Medications  No. Follow up visit  No.  Do you have questions or concerns about your Care? No.  Actions: * If pain score is 4 or above: No action needed, pain <4.

## 2015-08-20 ENCOUNTER — Encounter: Payer: Self-pay | Admitting: Internal Medicine

## 2015-09-04 ENCOUNTER — Other Ambulatory Visit: Payer: Self-pay | Admitting: Internal Medicine

## 2015-12-23 ENCOUNTER — Encounter: Payer: Self-pay | Admitting: Internal Medicine

## 2015-12-23 ENCOUNTER — Ambulatory Visit (INDEPENDENT_AMBULATORY_CARE_PROVIDER_SITE_OTHER): Payer: BLUE CROSS/BLUE SHIELD | Admitting: Internal Medicine

## 2015-12-23 VITALS — BP 118/74 | HR 85 | Temp 98.2°F | Ht 72.0 in | Wt 184.0 lb

## 2015-12-23 DIAGNOSIS — R5383 Other fatigue: Secondary | ICD-10-CM

## 2015-12-23 DIAGNOSIS — Z09 Encounter for follow-up examination after completed treatment for conditions other than malignant neoplasm: Secondary | ICD-10-CM | POA: Insufficient documentation

## 2015-12-23 DIAGNOSIS — M21619 Bunion of unspecified foot: Secondary | ICD-10-CM | POA: Diagnosis not present

## 2015-12-23 DIAGNOSIS — I10 Essential (primary) hypertension: Secondary | ICD-10-CM | POA: Diagnosis not present

## 2015-12-23 NOTE — Assessment & Plan Note (Signed)
HTN: Well-controlled Fatigue: See last office visit,  labs were okay except for a slightly elevated but still within normal TSH. Symptoms resolved, recheck TSH when he comes back Colon polyps: Multiple questions about the results and the procedure answered to the patient. L Bunion: Causing some discomfort, this could be corrected surgically if so desired, patient is not ready for that step, when ready we could refer him to orthopedic surgery. RTC 06-2016 cpx

## 2015-12-23 NOTE — Patient Instructions (Signed)
    GO TO THE FRONT DESK Schedule your next appointment for a  Physical by 06-2016, fasting

## 2015-12-23 NOTE — Progress Notes (Signed)
Pre visit review using our clinic review tool, if applicable. No additional management support is needed unless otherwise documented below in the visit note. 

## 2015-12-23 NOTE — Progress Notes (Signed)
   Subjective:    Patient ID: Larry Burgess, male    DOB: 1966/05/10, 50 y.o.   MRN: [id number]  DOS:  12/23/2015 Type of visit - description : Follow-up Interval history: At the last visit, he complained of fatigue, labs were okay, at this point he is doing better, back to normal. Has questions about his recent colonoscopy Has a left bunion, causing problems.    Review of Systems   Past Medical History  Diagnosis Date  . Hypertension   . Herpes     Past Surgical History  Procedure Laterality Date  . Hernia repair  1995    Social History   Social History  . Marital Status: Married    Spouse Name: N/A  . Number of Children: 0  . Years of Education: N/A   Occupational History  . engineer     Social History Main Topics  . Smoking status: Never Smoker   . Smokeless tobacco: Never Used  . Alcohol Use: 0.0 oz/week    0 Standard drinks or equivalent per week     Comment: occasionally  . Drug Use: No  . Sexual Activity: Yes   Other Topics Concern  . Not on file   Social History Narrative   Original from [place], moved to [place] at age 58   Household- pt and wife        Medication List       This list is accurate as of: 12/23/15  5:49 PM.  Always use your most recent med list.               amLODipine 10 MG tablet  Commonly known as:  NORVASC  Take 1 tablet (10 mg total) by mouth daily.     valACYclovir 500 MG tablet  Commonly known as:  VALTREX  Take 1 tablet (500 mg total) by mouth 2 (two) times daily as needed.           Objective:   Physical Exam BP 118/74 mmHg  Pulse 85  Temp(Src) 98.2 F (36.8 C) (Oral)  Ht 6' (1.829 m)  Wt 184 lb (83.462 kg)  BMI 24.95 kg/m2  SpO2 97% General:   Well developed, well nourished . NAD.  HEENT:  Normocephalic . Face symmetric, atraumatic Lungs:  CTA B Normal respiratory effort, no intercostal retractions, no accessory muscle use. Heart: RRR,  no murmur.  No pretibial edema bilaterally  MSK: Left foot  w/a bunion deformity, the second toe is going over the great toe and he has some calluses Neurologic:  alert & oriented X3.  Speech normal, gait appropriate for age and unassisted Psych--  Cognition and judgment appear intact.  Cooperative with normal attention span and concentration.  Behavior appropriate. No anxious or depressed appearing.      Assessment & Plan:   Assessment HTN HSV  PLAN: HTN: Well-controlled Fatigue: See last office visit,  labs were okay except for a slightly elevated but still within normal TSH. Symptoms resolved, recheck TSH when he comes back Colon polyps: Multiple questions about the results and the procedure answered to the patient. L Bunion: Causing some discomfort, this could be corrected surgically if so desired, patient is not ready for that step, when ready we could refer him to orthopedic surgery. RTC 06-2016 cpx

## 2016-05-08 ENCOUNTER — Other Ambulatory Visit: Payer: Self-pay | Admitting: Internal Medicine

## 2016-06-28 ENCOUNTER — Encounter: Payer: Self-pay | Admitting: Internal Medicine

## 2016-06-28 ENCOUNTER — Ambulatory Visit (INDEPENDENT_AMBULATORY_CARE_PROVIDER_SITE_OTHER): Payer: BLUE CROSS/BLUE SHIELD | Admitting: Internal Medicine

## 2016-06-28 VITALS — BP 118/78 | HR 79 | Temp 97.7°F | Resp 14 | Ht 72.0 in | Wt 185.5 lb

## 2016-06-28 DIAGNOSIS — Z0000 Encounter for general adult medical examination without abnormal findings: Secondary | ICD-10-CM | POA: Diagnosis not present

## 2016-06-28 LAB — BASIC METABOLIC PANEL
BUN: 11 mg/dL (ref 6–23)
CALCIUM: 9.1 mg/dL (ref 8.4–10.5)
CHLORIDE: 102 meq/L (ref 96–112)
CO2: 29 mEq/L (ref 19–32)
CREATININE: 0.86 mg/dL (ref 0.40–1.50)
GFR: 99.3 mL/min (ref >60.00)
Glucose, Bld: 87 mg/dL (ref 70–99)
Potassium: 4 mEq/L (ref 3.5–5.1)
Sodium: 137 mEq/L (ref 135–145)

## 2016-06-28 LAB — T3, FREE: T3 FREE: 3.2 pg/mL (ref 2.3–4.2)

## 2016-06-28 LAB — T4, FREE: FREE T4: 0.91 ng/dL (ref 0.60–1.60)

## 2016-06-28 LAB — LIPID PANEL
CHOLESTEROL: 197 mg/dL (ref 0–200)
HDL: 46 mg/dL (ref >39.00)
LDL CALC: 115 mg/dL — AB (ref 0–99)
NonHDL: 150.56
TRIGLYCERIDES: 178 mg/dL — AB (ref 0.0–149.0)
Total CHOL/HDL Ratio: 4
VLDL: 35.6 mg/dL (ref 0.0–40.0)

## 2016-06-28 LAB — TSH: TSH: 2.61 u[IU]/mL (ref 0.35–4.50)

## 2016-06-28 MED ORDER — AMLODIPINE BESYLATE 10 MG PO TABS: 10.0000 mg | ORAL_TABLET | Freq: Every day | ORAL | 3 refills | Status: DC

## 2016-06-28 MED ORDER — VALACYCLOVIR HCL 500 MG PO TABS: 500.0000 mg | ORAL_TABLET | Freq: Two times a day (BID) | ORAL | 3 refills | Status: DC | PRN

## 2016-06-28 NOTE — Assessment & Plan Note (Addendum)
Td 2010 per pt; Declined a flu shot Colon cancer screening: Cscope 08-2015, 5 years  DRE - PSA wnl 2017 Diet-exercise discussed Labs : FLP, BMP, TSH, free T3, free T4 (TSH was upper normal limits last year)

## 2016-06-28 NOTE — Progress Notes (Signed)
Subjective:    Patient ID: Larry Burgess, male    DOB: [birth year], 51 y.o.   MRN: [id number]  DOS:  06/28/2016 Type of visit - description :  CPX Interval history: No major concerns, feeling well  Review of Systems  Constitutional: No fever. No chills. No unexplained wt changes. No unusual sweats  HEENT: No dental problems, no ear discharge, no facial swelling, no voice changes. No eye discharge, no eye  redness , no  intolerance to light   Respiratory: No wheezing , no  difficulty breathing. No cough , no mucus production  Cardiovascular: No CP, no leg swelling , no  Palpitations  GI: no nausea, no vomiting, no diarrhea , no  abdominal pain.  No blood in the stools. No dysphagia, no odynophagia    Endocrine: No polyphagia, no polyuria , no polydipsia  GU: No dysuria, gross hematuria, difficulty urinating. No urinary urgency, no frequency.  Musculoskeletal: bunions  still hurting  Skin: No change in the color of the skin, palor , no  Rash  Allergic, immunologic: No environmental allergies , no  food allergies  Neurological: No dizziness no  syncope. No headaches. No diplopia, no slurred, no slurred speech, no motor deficits, no facial  Numbness  Hematological: No enlarged lymph nodes, no easy bruising , no unusual bleedings  Psychiatry: No suicidal ideas, no hallucinations, no beavior problems, no confusion.  No unusual/severe anxiety, no depression  Past Medical History:  Diagnosis Date  . Herpes   . Hypertension     Past Surgical History:  Procedure Laterality Date  . HERNIA REPAIR  1995    Social History   Social History  . Marital status: Married    Spouse name: N/A  . Number of children: 0  . Years of education: N/A   Occupational History  . Film/video editor   Social History Main Topics  . Smoking status: Never Smoker  . Smokeless tobacco: Never Used  . Alcohol use 0.0 oz/week     Comment: occasionally  . Drug use: No  . Sexual activity: Yes    Other Topics Concern  . Not on file   Social History Narrative   Original from [place], moved to [place] at age 2   Household- pt and wife     Family History  Problem Relation Age of Onset  . Diabetes Brother   . CAD Neg Hx   . Stroke Neg Hx   . Colon cancer Neg Hx   . Prostate cancer Neg Hx   . Esophageal cancer Neg Hx   . Rectal cancer Neg Hx   . Stomach cancer Neg Hx      Allergies as of 06/28/2016   No Known Allergies     Medication List       Accurate as of 06/28/16  8:08 AM. Always use your most recent med list.          amLODipine 10 MG tablet Commonly known as:  NORVASC Take 1 tablet (10 mg total) by mouth daily.   valACYclovir 500 MG tablet Commonly known as:  VALTREX Take 1 tablet (500 mg total) by mouth 2 (two) times daily as needed.          Objective:   Physical Exam BP 118/78 (BP Location: Left Arm, Patient Position: Sitting, Cuff Size: Normal)   Pulse 79   Temp 97.7 F (36.5 C) (Oral)   Resp 14   Ht 6' (1.829 m)   Wt 185 lb 8 oz (  84.1 kg)   SpO2 97%   BMI 25.16 kg/m   General:   Well developed, well nourished . NAD.  Neck: No  thyromegaly  HEENT:  Normocephalic . Face symmetric, atraumatic. Ears asymmetric, different shape, since birth Lungs:  CTA B Normal respiratory effort, no intercostal retractions, no accessory muscle use. Heart: RRR,  no murmur.  No pretibial edema bilaterally  Abdomen:  Not distended, soft, non-tender. No rebound or rigidity.   Skin: Exposed areas without rash. Not pale. Not jaundice Neurologic:  alert & oriented X3.  Speech normal, gait appropriate for age and unassisted Strength symmetric and appropriate for age.  Psych: Cognition and judgment appear intact.  Cooperative with normal attention span and concentration.  Behavior appropriate. No anxious or depressed appearing.    Assessment & Plan:   Assessment HTN HSV  PLAN: HTN: Well-controlled. Labs. RF medications today. H/o fatigue: Not an issue  at this time. RTC one year

## 2016-06-28 NOTE — Assessment & Plan Note (Signed)
HTN: Well-controlled. Labs. RF medications today. H/o fatigue: Not an issue at this time. RTC one year

## 2016-06-28 NOTE — Progress Notes (Signed)
Pre visit review using our clinic review tool, if applicable. No additional management support is needed unless otherwise documented below in the visit note. 

## 2016-06-28 NOTE — Patient Instructions (Signed)
GO TO THE LAB : Get the blood work     GO TO THE FRONT DESK Schedule your next appointment for a  Physical in 1 year  

## 2016-08-02 ENCOUNTER — Telehealth: Payer: Self-pay

## 2016-08-02 NOTE — Telephone Encounter (Signed)
Forms received, completed, and mailed back w/ last 3 OV notes, labs, and EKG report w/ envelope provided. Copy of forms sent for scanning.

## 2017-07-04 ENCOUNTER — Ambulatory Visit (INDEPENDENT_AMBULATORY_CARE_PROVIDER_SITE_OTHER): Payer: BLUE CROSS/BLUE SHIELD | Admitting: Internal Medicine

## 2017-07-04 ENCOUNTER — Encounter: Payer: Self-pay | Admitting: Internal Medicine

## 2017-07-04 VITALS — BP 126/68 | HR 65 | Temp 97.6°F | Resp 14 | Ht 72.0 in | Wt 178.5 lb

## 2017-07-04 DIAGNOSIS — Z0000 Encounter for general adult medical examination without abnormal findings: Secondary | ICD-10-CM

## 2017-07-04 DIAGNOSIS — Z125 Encounter for screening for malignant neoplasm of prostate: Secondary | ICD-10-CM | POA: Diagnosis not present

## 2017-07-04 LAB — CBC WITH DIFFERENTIAL/PLATELET
Basophils Absolute: 0 10*3/uL (ref 0.0–0.1)
Basophils Relative: 0.5 % (ref 0.0–3.0)
EOS PCT: 6.2 % — AB (ref 0.0–5.0)
Eosinophils Absolute: 0.4 10*3/uL (ref 0.0–0.7)
HEMATOCRIT: 49.5 % (ref 39.0–52.0)
HEMOGLOBIN: 17.3 g/dL — AB (ref 13.0–17.0)
LYMPHS PCT: 24.3 % (ref 12.0–46.0)
Lymphs Abs: 1.4 10*3/uL (ref 0.7–4.0)
MCHC: 34.9 g/dL (ref 30.0–36.0)
MCV: 91.7 fl (ref 78.0–100.0)
MONOS PCT: 8.8 % (ref 3.0–12.0)
Monocytes Absolute: 0.5 10*3/uL (ref 0.1–1.0)
Neutro Abs: 3.4 10*3/uL (ref 1.4–7.7)
Neutrophils Relative %: 60.2 % (ref 43.0–77.0)
Platelets: 199 10*3/uL (ref 150.0–400.0)
RBC: 5.4 Mil/uL (ref 4.22–5.81)
RDW: 12.4 % (ref 11.5–15.5)
WBC: 5.7 10*3/uL (ref 4.0–10.5)

## 2017-07-04 LAB — COMPREHENSIVE METABOLIC PANEL
ALK PHOS: 55 U/L (ref 39–117)
ALT: 23 U/L (ref 0–53)
AST: 21 U/L (ref 0–37)
Albumin: 4.4 g/dL (ref 3.5–5.2)
BUN: 11 mg/dL (ref 6–23)
CHLORIDE: 100 meq/L (ref 96–112)
CO2: 30 mEq/L (ref 19–32)
Calcium: 9.5 mg/dL (ref 8.4–10.5)
Creatinine, Ser: 0.77 mg/dL (ref 0.40–1.50)
GFR: 112.37 mL/min (ref >60.00)
Glucose, Bld: 85 mg/dL (ref 70–99)
POTASSIUM: 4.3 meq/L (ref 3.5–5.1)
Sodium: 137 mEq/L (ref 135–145)
TOTAL PROTEIN: 7.3 g/dL (ref 6.0–8.3)
Total Bilirubin: 0.7 mg/dL (ref 0.2–1.2)

## 2017-07-04 LAB — LIPID PANEL
CHOL/HDL RATIO: 4
Cholesterol: 194 mg/dL (ref 0–200)
HDL: 44.8 mg/dL (ref >39.00)
LDL CALC: 118 mg/dL — AB (ref 0–99)
NONHDL: 149.4
Triglycerides: 155 mg/dL — ABNORMAL HIGH (ref 0.0–149.0)
VLDL: 31 mg/dL (ref 0.0–40.0)

## 2017-07-04 LAB — PSA: PSA: 1.32 ng/mL (ref 0.10–4.00)

## 2017-07-04 LAB — TSH: TSH: 2.55 u[IU]/mL (ref 0.35–4.50)

## 2017-07-04 NOTE — Assessment & Plan Note (Addendum)
-  Td 2010 per pt; Declined a flu shot - CCS: Colon cancer screening: Cscope 08-2015, 5 years  -prostate ca screening: DRE wnl , check a PSA -Diet-exercise discussed Labs : CMP, FLP, CBC, TSH, PSA

## 2017-07-04 NOTE — Patient Instructions (Signed)
GO TO THE LAB : Get the blood work     GO TO THE FRONT DESK Schedule your next appointment for a   Physical in 1 year    Check the  blood pressure   monthly weekly   Be sure your blood pressure is between 110/65 and  135/85. If it is consistently higher or lower, let me know  KNEE:  ICE at night   IBUPROFEN (Advil or Motrin) 200 mg 2 tablets every 6 hours as needed for pain for 2 -3 weeks .  Always take it with food because may cause gastritis and ulcers.  If you notice nausea, stomach pain, change in the color of stools --->  Stop the medicine and let us know  Knee sleeve

## 2017-07-04 NOTE — Progress Notes (Signed)
Subjective:    Patient ID: Larry Burgess, male    DOB: [birth year], 52 y.o.   MRN: [id number]  DOS:  07/04/2017 Type of visit - description : cpx Interval history: Other than the knee pain he is doing very well   Review of Systems  About 3 weeks ago he hyperextended the left knee, having on and off pain and occasional swelling.  No locking, no giving away.  A knee sleeve seems to be helping.  Other than above, a 14 point review of systems is negative     Past Medical History:  Diagnosis Date  . Herpes   . Hypertension     Past Surgical History:  Procedure Laterality Date  . HERNIA REPAIR  1995    Social History   Socioeconomic History  . Marital status: Married    Spouse name: Not on file  . Number of children: 0  . Years of education: Not on file  . Highest education level: Not on file  Social Needs  . Financial resource strain: Not on file  . Food insecurity - worry: Not on file  . Food insecurity - inability: Not on file  . Transportation needs - medical: Not on file  . Transportation needs - non-medical: Not on file  Occupational History  . 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 Smoking status: Never Smoker  . Smokeless tobacco: Never Used  Substance and Sexual Activity  . Alcohol use: Yes    Alcohol/week: 0.0 oz    Comment: occasionally  . Drug use: No  . Sexual activity: Yes  Other Topics Concern  . Not on file  Social History Narrative   Original from [place], moved to [place] at age 76   Household- pt and wife     Family History  Problem Relation Age of Onset  . Diabetes Brother   . Prostate cancer Paternal Grandfather   . Prostate cancer Paternal Uncle        ??  . CAD Neg Hx   . Stroke Neg Hx   . Colon cancer Neg Hx   . Esophageal cancer Neg Hx   . Rectal cancer Neg Hx   . Stomach cancer Neg Hx      Allergies as of 07/04/2017   No Known Allergies     Medication List        Accurate as of 07/04/17  5:02  PM. Always use your most recent med list.          amLODipine 10 MG tablet Commonly known as:  NORVASC Take 1 tablet (10 mg total) by mouth daily.   valACYclovir 500 MG tablet Commonly known as:  VALTREX Take 1 tablet (500 mg total) by mouth 2 (two) times daily as needed.          Objective:   Physical Exam BP 126/68 (BP Location: Right Arm, Patient Position: Sitting, Cuff Size: Normal)   Pulse 65   Temp 97.6 F (36.4 C) (Oral)   Resp 14   Ht 6' (1.829 m)   Wt 178 lb 8 oz (81 kg)   SpO2 98%   BMI 24.21 kg/m  General:   Well developed, well nourished . NAD.  Neck: No  thyromegaly  HEENT:  Normocephalic . Face symmetric, atraumatic Lungs:  CTA B Normal respiratory effort, no intercostal retractions, no accessory muscle use. Heart: RRR,  no murmur.  No pretibial edema bilaterally  Abdomen:  Not distended, soft,  non-tender. No rebound or rigidity. MSK: Knees symmetric, both are stable.  No swelling or redness, range of motion normal Skin: Exposed areas without rash. Not pale. Not jaundice Rectal:  External abnormalities: none. Normal sphincter tone. No rectal masses or tenderness.  Stool brown  Prostate: Prostate gland firm and smooth, no enlargement, nodularity, tenderness, mass, asymmetry or induration.  Neurologic:  alert & oriented X3.  Speech normal, gait appropriate for age and unassisted Strength symmetric and appropriate for age.  Psych: Cognition and judgment appear intact.  Cooperative with normal attention span and concentration.  Behavior appropriate. No anxious or depressed appearing.     Assessment & Plan:    Assessment HTN HSV  PLAN: HTN: Seems controlled, continue amlodipine, monitor BPs monthly. Knee injury: Left knee injury, likely a sprain, recommend ice, Motrin, knee sleeve.  If not improving in 2-3 weeks, recommend to see sports medicine. RTC 1 year

## 2017-07-04 NOTE — Progress Notes (Signed)
Pre visit review using our clinic review tool, if applicable. No additional management support is needed unless otherwise documented below in the visit note. 

## 2017-07-04 NOTE — Assessment & Plan Note (Signed)
HTN: Seems controlled, continue amlodipine, monitor BPs monthly. Knee injury: Left knee injury, likely a sprain, recommend ice, Motrin, knee sleeve.  If not improving in 2-3 weeks, recommend to see sports medicine. RTC 1 year

## 2017-10-10 ENCOUNTER — Other Ambulatory Visit: Payer: Self-pay | Admitting: Internal Medicine

## 2018-07-05 ENCOUNTER — Encounter: Payer: Self-pay | Admitting: Internal Medicine

## 2018-07-05 ENCOUNTER — Ambulatory Visit (INDEPENDENT_AMBULATORY_CARE_PROVIDER_SITE_OTHER): Payer: BLUE CROSS/BLUE SHIELD | Admitting: Internal Medicine

## 2018-07-05 VITALS — BP 126/80 | HR 86 | Temp 98.2°F | Resp 16 | Ht 72.0 in | Wt 188.5 lb

## 2018-07-05 DIAGNOSIS — Z0000 Encounter for general adult medical examination without abnormal findings: Secondary | ICD-10-CM | POA: Diagnosis not present

## 2018-07-05 DIAGNOSIS — Z23 Encounter for immunization: Secondary | ICD-10-CM | POA: Diagnosis not present

## 2018-07-05 LAB — COMPREHENSIVE METABOLIC PANEL
ALT: 26 U/L (ref 0–53)
AST: 20 U/L (ref 0–37)
Albumin: 4.4 g/dL (ref 3.5–5.2)
Alkaline Phosphatase: 50 U/L (ref 39–117)
BUN: 11 mg/dL (ref 6–23)
CHLORIDE: 100 meq/L (ref 96–112)
CO2: 32 meq/L (ref 19–32)
Calcium: 10 mg/dL (ref 8.4–10.5)
Creatinine, Ser: 0.91 mg/dL (ref 0.40–1.50)
GFR: 86.85 mL/min (ref >60.00)
Glucose, Bld: 85 mg/dL (ref 70–99)
Potassium: 4.4 mEq/L (ref 3.5–5.1)
Sodium: 137 mEq/L (ref 135–145)
Total Bilirubin: 0.6 mg/dL (ref 0.2–1.2)
Total Protein: 7.1 g/dL (ref 6.0–8.3)

## 2018-07-05 LAB — CBC WITH DIFFERENTIAL/PLATELET
Basophils Absolute: 0 K/uL (ref 0.0–0.1)
Basophils Relative: 0.7 % (ref 0.0–3.0)
Eosinophils Absolute: 0.4 K/uL (ref 0.0–0.7)
Eosinophils Relative: 5.3 % — ABNORMAL HIGH (ref 0.0–5.0)
HCT: 50.1 % (ref 39.0–52.0)
Hemoglobin: 17.3 g/dL — ABNORMAL HIGH (ref 13.0–17.0)
Lymphocytes Relative: 22.6 % (ref 12.0–46.0)
Lymphs Abs: 1.6 K/uL (ref 0.7–4.0)
MCHC: 34.6 g/dL (ref 30.0–36.0)
MCV: 92.3 fl (ref 78.0–100.0)
Monocytes Absolute: 0.6 K/uL (ref 0.1–1.0)
Monocytes Relative: 8.7 % (ref 3.0–12.0)
Neutro Abs: 4.3 K/uL (ref 1.4–7.7)
Neutrophils Relative %: 62.7 % (ref 43.0–77.0)
Platelets: 201 K/uL (ref 150.0–400.0)
RBC: 5.42 Mil/uL (ref 4.22–5.81)
RDW: 12.4 % (ref 11.5–15.5)
WBC: 6.9 K/uL (ref 4.0–10.5)

## 2018-07-05 LAB — LIPID PANEL
CHOLESTEROL: 205 mg/dL — AB (ref 0–200)
HDL: 46.1 mg/dL (ref >39.00)
LDL Cholesterol: 120 mg/dL — ABNORMAL HIGH (ref 0–99)
NonHDL: 158.55
Total CHOL/HDL Ratio: 4
Triglycerides: 193 mg/dL — ABNORMAL HIGH (ref 0.0–149.0)
VLDL: 38.6 mg/dL (ref 0.0–40.0)

## 2018-07-05 LAB — TSH: TSH: 3.24 u[IU]/mL (ref 0.35–4.50)

## 2018-07-05 MED ORDER — AMLODIPINE BESYLATE 10 MG PO TABS: 10.0000 mg | ORAL_TABLET | Freq: Every day | ORAL | 3 refills | Status: DC

## 2018-07-05 MED ORDER — VALACYCLOVIR HCL 500 MG PO TABS: 500.0000 mg | ORAL_TABLET | Freq: Two times a day (BID) | ORAL | 3 refills | Status: DC | PRN

## 2018-07-05 NOTE — Assessment & Plan Note (Signed)
-  Td 06/2018, declined flu shot again - CCS: Colon cancer screening: Cscope 08-2015, 5 years  -prostate ca screening: DRE   PSA 2019 wnl -Diet-exercise discussed Labs : CMP, FLP, CBC, TSH

## 2018-07-05 NOTE — Patient Instructions (Signed)
GO TO THE LAB : Get the blood work     GO TO THE FRONT DESK Schedule your next appointment   a physical exam in 1 year    Check the  blood pressure  monthly  Be sure your blood pressure is between 110/65 and  135/85. If it is consistently higher or lower, let me know

## 2018-07-05 NOTE — Progress Notes (Signed)
Pre visit review using our clinic review tool, if applicable. No additional management support is needed unless otherwise documented below in the visit note. 

## 2018-07-05 NOTE — Progress Notes (Signed)
Subjective:    Patient ID: Larry Burgess, male    DOB: [birth year], 53 y.o.   MRN: [id number]  DOS:  07/05/2018 Type of visit - description: CPX In general feeling well, he has a couple concerns.  See review of systems   Review of Systems Many years history of night sweats, somewhat concerned about it, denies fever, chills or weight loss Also has a sporadic pain at the left lateral chest near the armpit.  Had episodes 5 years ago and here lately two spells. Change in position or movement changes the pain.  No associated symptoms such as nausea, sweats, difficulty breathing or palpitations.  When he is active at work he is asymptomatic  Other than above, a 14 point review of systems is negative    Past Medical History:  Diagnosis Date  . Herpes   . Hypertension     Past Surgical History:  Procedure Laterality Date  . HERNIA REPAIR  1995    Social History   Socioeconomic History  . Marital status: Married    Spouse name: Not on file  . Number of children: 0  . Years of education: Not on file  . Highest education level: Not on file  Occupational History  . 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 Financial resource strain: Not on file  . Food insecurity:    Worry: Not on file    Inability: Not on file  . Transportation needs:    Medical: Not on file    Non-medical: Not on file  Tobacco Use  . Smoking status: Never Smoker  . Smokeless tobacco: Never Used  Substance and Sexual Activity  . Alcohol use: Yes    Alcohol/week: 0.0 standard drinks    Comment: occasionally  . Drug use: No  . Sexual activity: Yes  Lifestyle  . Physical activity:    Days per week: Not on file    Minutes per session: Not on file  . Stress: Not on file  Relationships  . Social connections:    Talks on phone: Not on file    Gets together: Not on file    Attends religious service: Not on file    Active member of club or organization: Not on file    Attends meetings of  clubs or organizations: Not on file    Relationship status: Not on file  . Intimate partner violence:    Fear of current or ex partner: Not on file    Emotionally abused: Not on file    Physically abused: Not on file    Forced sexual activity: Not on file  Other Topics Concern  . Not on file  Social History Narrative   Original from [place], moved to [place] at age 70   Household- pt and wife     Family History  Problem Relation Age of Onset  . Diabetes Brother   . Prostate cancer Paternal Grandfather   . Prostate cancer Paternal Uncle        ??  . Heart murmur Mother   . CAD Neg Hx   . Stroke Neg Hx   . Colon cancer Neg Hx   . Esophageal cancer Neg Hx   . Rectal cancer Neg Hx   . Stomach cancer Neg Hx      Allergies as of 07/05/2018   No Known Allergies     Medication List       Accurate as of July 05, 2018  3:38 PM. Always  use your most recent med list.        amLODipine 10 MG tablet Commonly known as:  NORVASC Take 1 tablet (10 mg total) by mouth daily.   valACYclovir 500 MG tablet Commonly known as:  VALTREX Take 1 tablet (500 mg total) by mouth 2 (two) times daily as needed.           Objective:   Physical Exam BP 126/80 (BP Location: Left Arm, Patient Position: Sitting, Cuff Size: Normal)   Pulse 86   Temp 98.2 F (36.8 C) (Oral)   Resp 16   Ht 6' (1.829 m)   Wt 188 lb 8 oz (85.5 kg)   SpO2 98%   BMI 25.57 kg/m      General: Well developed, NAD, BMI noted Neck: No  thyromegaly  HEENT:  Normocephalic . Face symmetric, atraumatic Lungs:  CTA B Normal respiratory effort, no intercostal retractions, no accessory muscle use. Heart: RRR,  no murmur.  No pretibial edema bilaterally  Abdomen:  Not distended, soft, non-tender. No rebound or rigidity.   Skin: Exposed areas without rash. Not pale. Not jaundice Neurologic:  alert & oriented X3.  Speech normal, gait appropriate for age and unassisted Strength symmetric and appropriate for age.    Psych: Cognition and judgment appear intact.  Cooperative with normal attention span and concentration.  Behavior appropriate. No anxious or depressed appearing.  Assessment    Assessment HTN HSV  PLAN: HTN: No ambulatory BPs, BP tolerates okay, recommend to check his BPs from time to time in the ambulatory setting, refill amlodipine HSV: On Valtrex as needed, RF today. Chest pain, as described above, very atypical for CAD. EKG today: nsr.  Recommend observation RTC 1 year

## 2018-07-05 NOTE — Assessment & Plan Note (Signed)
HTN: No ambulatory BPs, BP tolerates okay, recommend to check his BPs from time to time in the ambulatory setting, refill amlodipine HSV: On Valtrex as needed, RF today. Chest pain, as described above, very atypical for CAD. EKG today: nsr.  Recommend observation RTC 1 year

## 2019-02-12 DIAGNOSIS — Z20828 Contact with and (suspected) exposure to other viral communicable diseases: Secondary | ICD-10-CM | POA: Diagnosis not present

## 2019-03-12 ENCOUNTER — Other Ambulatory Visit: Payer: Self-pay

## 2019-03-13 ENCOUNTER — Ambulatory Visit (INDEPENDENT_AMBULATORY_CARE_PROVIDER_SITE_OTHER): Payer: BC Managed Care – PPO | Admitting: Internal Medicine

## 2019-03-13 ENCOUNTER — Encounter: Payer: Self-pay | Admitting: Internal Medicine

## 2019-03-13 VITALS — BP 148/99 | HR 88 | Temp 97.2°F | Resp 16 | Ht 72.0 in | Wt 192.4 lb

## 2019-03-13 DIAGNOSIS — R2232 Localized swelling, mass and lump, left upper limb: Secondary | ICD-10-CM

## 2019-03-13 DIAGNOSIS — G5601 Carpal tunnel syndrome, right upper limb: Secondary | ICD-10-CM | POA: Diagnosis not present

## 2019-03-13 NOTE — Progress Notes (Signed)
Subjective:    Patient ID: Larry Burgess, male    DOB: 1965-08-29, 53 y.o.   MRN: [id number]  DOS:  03/13/2019 Type of visit - description: acute  Report numbness on the thumb, second and third finger on the right hand for the last 2 weeks. Symptoms are present day or night. Reports no actual weakness. Symptoms feel like needles or like "when your arm go to sleep".  Also on 08-2018 he felt a lump at the left hand.   Review of Systems Has occasional neck pain but nothing severe. Denies any upper extremity injury.  No recent overuse. Specifically no pain at the wrist or elbow No headache, nausea, vomiting, diplopia. No difficulty with his gait.  Past Medical History:  Diagnosis Date  . Herpes   . Hypertension     Past Surgical History:  Procedure Laterality Date  . HERNIA REPAIR  1995    Social History   Socioeconomic History  . Marital status: Married    Spouse name: Not on file  . Number of children: 0  . Years of education: Not on file  . Highest education level: Not on file  Occupational History  . 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 Financial resource strain: Not on file  . Food insecurity    Worry: Not on file    Inability: Not on file  . Transportation needs    Medical: Not on file    Non-medical: Not on file  Tobacco Use  . Smoking status: Never Smoker  . Smokeless tobacco: Never Used  Substance and Sexual Activity  . Alcohol use: Yes    Alcohol/week: 0.0 standard drinks    Comment: occasionally  . Drug use: No  . Sexual activity: Yes  Lifestyle  . Physical activity    Days per week: Not on file    Minutes per session: Not on file  . Stress: Not on file  Relationships  . Social Herbalist on phone: Not on file    Gets together: Not on file    Attends religious service: Not on file    Active member of club or organization: Not on file    Attends meetings of clubs or organizations: Not on file    Relationship  status: Not on file  . Intimate partner violence    Fear of current or ex partner: Not on file    Emotionally abused: Not on file    Physically abused: Not on file    Forced sexual activity: Not on file  Other Topics Concern  . Not on file  Social History Narrative   Original from [place], moved to [place] at age 42   Household- pt and wife      Allergies as of 03/13/2019   No Known Allergies     Medication List       Accurate as of March 13, 2019  3:25 PM. If you have any questions, ask your nurse or doctor.        amLODipine 10 MG tablet Commonly known as: NORVASC Take 1 tablet (10 mg total) by mouth daily.   valACYclovir 500 MG tablet Commonly known as: VALTREX Take 1 tablet (500 mg total) by mouth 2 (two) times daily as needed.           Objective:   Physical Exam Musculoskeletal:       Arms:    BP (!) 148/99 (BP Location: Left Arm, Patient Position: Sitting, Cuff  Size: Small)   Pulse 88   Temp (!) 97.2 F (36.2 C) (Temporal)   Resp 16   Ht 6' (1.829 m)   Wt 192 lb 6 oz (87.3 kg)   SpO2 98%   BMI 26.09 kg/m  General:   Well developed, NAD, BMI noted. HEENT:  Normocephalic . Face symmetric, atraumatic Neck: No TTP, range of motion normal MSK: Hands and wrists symmetric without puffiness or synovitis. Neurologic:  alert & oriented X3.  Speech normal, gait appropriate for age and unassisted DTRs symmetric although reflex of the right biceps slightly decreased. Motor symmetric. Psych--  Cognition and judgment appear intact.  Cooperative with normal attention span and concentration.  Behavior appropriate. No anxious or depressed appearing.      Assessment    Assessment HTN HSV  PLAN: Hand paresthesia: Given location I suspect CTS, history is somewhat atypical because he has steady rather than gradual numbness for 2 weeks.  ROS does not point to any other specific condition. Plan: Ortho referral, CTS? Use CTS brace. Call if not better Skin  lesion, left hand: Ortho referral

## 2019-03-13 NOTE — Progress Notes (Signed)
Pre visit review using our clinic review tool, if applicable. No additional management support is needed unless otherwise documented below in the visit note. 

## 2019-03-13 NOTE — Assessment & Plan Note (Signed)
Hand paresthesia: Given location I suspect CTS, history is somewhat atypical because he has steady rather than gradual numbness for 2 weeks.  ROS does not point to any other specific condition. Plan: Ortho referral, CTS? Use CTS brace. Call if not better Skin lesion, left hand: Ortho referral

## 2019-03-13 NOTE — Patient Instructions (Signed)
We are referring you to a hand surgeon  Use a CTS brace if possible day-night   Call if symptoms are severe

## 2019-04-29 DIAGNOSIS — G5601 Carpal tunnel syndrome, right upper limb: Secondary | ICD-10-CM | POA: Diagnosis not present

## 2019-04-29 DIAGNOSIS — M79641 Pain in right hand: Secondary | ICD-10-CM | POA: Diagnosis not present

## 2019-04-29 DIAGNOSIS — M720 Palmar fascial fibromatosis [Dupuytren]: Secondary | ICD-10-CM | POA: Insufficient documentation

## 2019-04-29 DIAGNOSIS — M79642 Pain in left hand: Secondary | ICD-10-CM | POA: Diagnosis not present

## 2019-07-08 ENCOUNTER — Other Ambulatory Visit: Payer: Self-pay

## 2019-07-09 ENCOUNTER — Ambulatory Visit (INDEPENDENT_AMBULATORY_CARE_PROVIDER_SITE_OTHER): Payer: BC Managed Care – PPO | Admitting: Internal Medicine

## 2019-07-09 ENCOUNTER — Encounter: Payer: Self-pay | Admitting: Internal Medicine

## 2019-07-09 ENCOUNTER — Other Ambulatory Visit: Payer: Self-pay

## 2019-07-09 VITALS — BP 134/82 | HR 75 | Temp 96.8°F | Resp 16 | Ht 72.0 in | Wt 187.5 lb

## 2019-07-09 DIAGNOSIS — Z0000 Encounter for general adult medical examination without abnormal findings: Secondary | ICD-10-CM | POA: Diagnosis not present

## 2019-07-09 DIAGNOSIS — I10 Essential (primary) hypertension: Secondary | ICD-10-CM | POA: Diagnosis not present

## 2019-07-09 DIAGNOSIS — Z125 Encounter for screening for malignant neoplasm of prostate: Secondary | ICD-10-CM | POA: Diagnosis not present

## 2019-07-09 LAB — CBC WITH DIFFERENTIAL/PLATELET
Basophils Absolute: 0 10*3/uL (ref 0.0–0.1)
Basophils Relative: 0.6 % (ref 0.0–3.0)
Eosinophils Absolute: 0.4 10*3/uL (ref 0.0–0.7)
Eosinophils Relative: 6.3 % — ABNORMAL HIGH (ref 0.0–5.0)
HCT: 49.2 % (ref 39.0–52.0)
Hemoglobin: 16.8 g/dL (ref 13.0–17.0)
Lymphocytes Relative: 26 % (ref 12.0–46.0)
Lymphs Abs: 1.7 10*3/uL (ref 0.7–4.0)
MCHC: 34.2 g/dL (ref 30.0–36.0)
MCV: 92.2 fl (ref 78.0–100.0)
Monocytes Absolute: 0.6 10*3/uL (ref 0.1–1.0)
Monocytes Relative: 9.2 % (ref 3.0–12.0)
Neutro Abs: 3.9 10*3/uL (ref 1.4–7.7)
Neutrophils Relative %: 57.9 % (ref 43.0–77.0)
Platelets: 199 10*3/uL (ref 150.0–400.0)
RBC: 5.33 Mil/uL (ref 4.22–5.81)
RDW: 12.4 % (ref 11.5–15.5)
WBC: 6.7 10*3/uL (ref 4.0–10.5)

## 2019-07-09 LAB — LIPID PANEL
Cholesterol: 198 mg/dL (ref 0–200)
HDL: 44 mg/dL (ref >39.00)
LDL Cholesterol: 116 mg/dL — ABNORMAL HIGH (ref 0–99)
NonHDL: 154.08
Total CHOL/HDL Ratio: 5
Triglycerides: 191 mg/dL — ABNORMAL HIGH (ref 0.0–149.0)
VLDL: 38.2 mg/dL (ref 0.0–40.0)

## 2019-07-09 LAB — PSA: PSA: 0.83 ng/mL (ref 0.10–4.00)

## 2019-07-09 LAB — COMPREHENSIVE METABOLIC PANEL
ALT: 29 U/L (ref 0–53)
AST: 27 U/L (ref 0–37)
Albumin: 4.3 g/dL (ref 3.5–5.2)
Alkaline Phosphatase: 53 U/L (ref 39–117)
BUN: 11 mg/dL (ref 6–23)
CO2: 30 mEq/L (ref 19–32)
Calcium: 9 mg/dL (ref 8.4–10.5)
Chloride: 101 mEq/L (ref 96–112)
Creatinine, Ser: 0.84 mg/dL (ref 0.40–1.50)
GFR: 94.9 mL/min (ref >60.00)
Glucose, Bld: 88 mg/dL (ref 70–99)
Potassium: 3.9 mEq/L (ref 3.5–5.1)
Sodium: 137 mEq/L (ref 135–145)
Total Bilirubin: 0.6 mg/dL (ref 0.2–1.2)
Total Protein: 6.8 g/dL (ref 6.0–8.3)

## 2019-07-09 LAB — TSH: TSH: 2.37 u[IU]/mL (ref 0.35–4.50)

## 2019-07-09 MED ORDER — AMLODIPINE BESYLATE 10 MG PO TABS: 10.0000 mg | ORAL_TABLET | Freq: Every day | ORAL | 3 refills | Status: DC

## 2019-07-09 MED ORDER — VALACYCLOVIR HCL 500 MG PO TABS: 500.0000 mg | ORAL_TABLET | Freq: Two times a day (BID) | ORAL | 3 refills | Status: DC | PRN

## 2019-07-09 NOTE — Progress Notes (Signed)
   Subjective:    Patient ID: Larry Burgess, male    DOB: April 03, 1966, 54 y.o.   MRN: [id number]  DOS:  07/09/2019 Type of visit - description: cpx No major concerns  Review of Systems The last visit, had CTS symptoms, saw orthopedics.  Doing better  A 14 point review of systems is negative   Past Medical History:  Diagnosis Date  . Herpes   . Hypertension     Past Surgical History:  Procedure Laterality Date  . HERNIA REPAIR  1995   Family History  Problem Relation Age of Onset  . Diabetes Brother   . Prostate cancer Paternal Grandfather   . Prostate cancer Paternal Uncle        ??  . Heart murmur Mother   . CAD Neg Hx   . Stroke Neg Hx   . Colon cancer Neg Hx   . Esophageal cancer Neg Hx   . Rectal cancer Neg Hx   . Stomach cancer Neg Hx         Objective:   Physical Exam BP 134/82 (BP Location: Left Arm, Patient Position: Sitting, Cuff Size: Normal)   Pulse 75   Temp (!) 96.8 F (36 C) (Temporal)   Resp 16   Ht 6' (1.829 m)   Wt 187 lb 8 oz (85 kg)   SpO2 100%   BMI 25.43 kg/m  General: Well developed, NAD, BMI noted Neck: No  thyromegaly  HEENT:  Normocephalic . Face symmetric, atraumatic Lungs:  CTA B Normal respiratory effort, no intercostal retractions, no accessory muscle use. Heart: RRR,  no murmur.  No pretibial edema bilaterally  Abdomen:  Not distended, soft, non-tender. No rebound or rigidity.   Skin: Exposed areas without rash. Not pale. Not jaundice DRE: Normal sphincter tone, no stools, prostate normal. Neurologic:  alert & oriented X3.  Speech normal, gait appropriate for age and unassisted Strength symmetric and appropriate for age.  Psych: Cognition and judgment appear intact.  Cooperative with normal attention span and concentration.  Behavior appropriate. No anxious or depressed appearing.     Assessment      Assessment HTN HSV  PLAN: Here for CPX HTN: On amlodipine, no ambulatory BPs, BP today is normal.  No  change Carpal tunnel syndrome: Essentially self resolved to the point that now symptoms are mild, he saw orthopedic surgery and offered to RTC PRN if symptoms increase Skin lesion, left hand: Seen by orthopedics, they agree on observation and go back to them if any changes RTC 1 year     This visit occurred during the SARS-CoV-2 public health emergency.  Safety protocols were in place, including screening questions prior to the visit, additional usage of staff PPE, and extensive cleaning of exam room while observing appropriate contact time as indicated for disinfecting solutions.

## 2019-07-09 NOTE — Patient Instructions (Signed)
GO TO THE LAB : Get the blood work     GO TO THE FRONT DESK Schedule your next appointment   for a physical exam in 1 year   Check your blood pressure once a month BP GOAL is between 110/65 and  135/85. If it is consistently higher or lower, let me know

## 2019-07-09 NOTE — Progress Notes (Signed)
iPre visit review using our clinic review tool, if applicable. No additional management support is needed unless otherwise documented below in the visit note.

## 2019-07-10 NOTE — Assessment & Plan Note (Signed)
Here for CPX HTN: On amlodipine, no ambulatory BPs, BP today is normal.  No change Carpal tunnel syndrome: Essentially self resolved to the point that now symptoms are mild, he saw orthopedic surgery and offered to RTC PRN if symptoms increase Skin lesion, left hand: Seen by orthopedics, they agree on observation and go back to them if any changes RTC 1 year

## 2019-07-10 NOTE — Assessment & Plan Note (Signed)
-  Td 06/2018, declined flu shot again, will take the covid vaccine - CCS: Colon cancer screening: Cscope 08-2015, 5 years  -prostate ca screening: DRE normal, check a PSA   -Diet-exercise discussed -Labs CMP, FLP, CBC, TSH, PSA  -CV RF 10 years calculated at 7.2%

## 2019-12-24 ENCOUNTER — Other Ambulatory Visit: Payer: Self-pay

## 2019-12-24 ENCOUNTER — Telehealth: Payer: BC Managed Care – PPO | Admitting: Internal Medicine

## 2019-12-24 ENCOUNTER — Encounter: Payer: Self-pay | Admitting: Internal Medicine

## 2019-12-24 VITALS — Ht 72.0 in | Wt 185.0 lb

## 2019-12-24 DIAGNOSIS — R109 Unspecified abdominal pain: Secondary | ICD-10-CM

## 2019-12-24 NOTE — Progress Notes (Signed)
Pre visit review using our clinic review tool, if applicable. No additional management support is needed unless otherwise documented below in the visit note. 

## 2019-12-24 NOTE — Progress Notes (Signed)
Patient was seen virtually today. His main concern is abdominal pain.  No acute symptoms this is going on for several weeks. He needs to be seen in person, we agreed to be seen at the office on July 15, 11:20 AM. Effie Shy

## 2019-12-26 ENCOUNTER — Encounter: Payer: Self-pay | Admitting: Internal Medicine

## 2019-12-26 ENCOUNTER — Ambulatory Visit (INDEPENDENT_AMBULATORY_CARE_PROVIDER_SITE_OTHER): Payer: BC Managed Care – PPO | Admitting: Internal Medicine

## 2019-12-26 ENCOUNTER — Other Ambulatory Visit: Payer: Self-pay

## 2019-12-26 VITALS — BP 137/90 | HR 63 | Temp 98.3°F | Resp 16 | Ht 72.0 in | Wt 189.0 lb

## 2019-12-26 DIAGNOSIS — R1030 Lower abdominal pain, unspecified: Secondary | ICD-10-CM

## 2019-12-26 DIAGNOSIS — R1084 Generalized abdominal pain: Secondary | ICD-10-CM | POA: Diagnosis not present

## 2019-12-26 DIAGNOSIS — E611 Iron deficiency: Secondary | ICD-10-CM

## 2019-12-26 LAB — PSA: PSA: 1.22 ng/mL (ref 0.10–4.00)

## 2019-12-26 LAB — CBC
HCT: 48.3 % (ref 39.0–52.0)
Hemoglobin: 17 g/dL (ref 13.0–17.0)
MCHC: 35.2 g/dL (ref 30.0–36.0)
MCV: 92.3 fl (ref 78.0–100.0)
Platelets: 195 10*3/uL (ref 150.0–400.0)
RBC: 5.23 Mil/uL (ref 4.22–5.81)
RDW: 13.1 % (ref 11.5–15.5)
WBC: 8.2 10*3/uL (ref 4.0–10.5)

## 2019-12-26 LAB — COMPREHENSIVE METABOLIC PANEL
ALT: 26 U/L (ref 0–53)
AST: 21 U/L (ref 0–37)
Albumin: 4.6 g/dL (ref 3.5–5.2)
Alkaline Phosphatase: 51 U/L (ref 39–117)
BUN: 12 mg/dL (ref 6–23)
CO2: 29 mEq/L (ref 19–32)
Calcium: 9.5 mg/dL (ref 8.4–10.5)
Chloride: 100 mEq/L (ref 96–112)
Creatinine, Ser: 0.89 mg/dL (ref 0.40–1.50)
GFR: 88.62 mL/min (ref >60.00)
Glucose, Bld: 91 mg/dL (ref 70–99)
Potassium: 4 mEq/L (ref 3.5–5.1)
Sodium: 137 mEq/L (ref 135–145)
Total Bilirubin: 0.6 mg/dL (ref 0.2–1.2)
Total Protein: 7.2 g/dL (ref 6.0–8.3)

## 2019-12-26 LAB — POC URINALSYSI DIPSTICK (AUTOMATED)
Bilirubin, UA: NEGATIVE
Blood, UA: NEGATIVE
Glucose, UA: NEGATIVE
Ketones, UA: NEGATIVE
Leukocytes, UA: NEGATIVE
Nitrite, UA: NEGATIVE
Protein, UA: NEGATIVE
Spec Grav, UA: >=1.03 — AB (ref 1.010–1.025)
Urobilinogen, UA: 0.2 E.U./dL
pH, UA: 5 (ref 5.0–8.0)

## 2019-12-26 NOTE — Progress Notes (Signed)
   Subjective:    Patient ID: Larry Burgess, male    DOB: Apr 03, 1966, 54 y.o.   MRN: [id number]  DOS:  12/26/2019 Type of visit - description: Acute -Several months ago he developed dysuria & cloudy urine, at the time he took cranberry juice and symptoms resolved.  -However in the last 6 to 8 weeks he developed  persistent lower abdomen/suprapubic discomfort described as a superficia dullness, fullness; better after a BM or urination??   No fever chills Stools are on and off loose No blood in the stools, no weight loss. No dysuria or gross hematuria.  No penile discharge or high risk sexual activities. No urinary freq, no slow flow  No testicular pain No genital rash, he takes valcyclovir daily for HSV  Review of Systems See above   Past Medical History:  Diagnosis Date  . Herpes   . Hypertension     Past Surgical History:  Procedure Laterality Date  . HERNIA REPAIR  1995    Allergies as of 12/26/2019   No Known Allergies     Medication List       Accurate as of December 26, 2019 11:59 PM. If you have any questions, ask your nurse or doctor.        amLODipine 10 MG tablet Commonly known as: NORVASC Take 1 tablet (10 mg total) by mouth daily.   valACYclovir 500 MG tablet Commonly known as: VALTREX Take 1 tablet (500 mg total) by mouth 2 (two) times daily as needed.          Objective:   Physical Exam Abdominal:      BP 137/90 (BP Location: Left Arm, Patient Position: Sitting, Cuff Size: Small)   Pulse 63   Temp 98.3 F (36.8 C) (Oral)   Resp 16   Ht 6' (1.829 m)   Wt 189 lb (85.7 kg)   SpO2 97%   BMI 25.63 kg/m  General:   Well developed, NAD, BMI noted.  HEENT:  Normocephalic . Face symmetric, atraumatic Lungs:  CTA B Normal respiratory effort, no intercostal retractions, no accessory muscle use. Heart: RRR,  no murmur.  Abdomen:  Not distended, soft, non-tender. No rebound or rigidity.   Skin: Not pale. Not jaundice GU: Penis without  discharge or lesions, testicles normal, not tender DRE: Normal prostate, no stools found. Lower extremities: no pretibial edema bilaterally  Neurologic:  alert & oriented X3.  Speech normal, gait appropriate for age and unassisted Psych--  Cognition and judgment appear intact.  Cooperative with normal attention span and concentration.  Behavior appropriate. No anxious or depressed appearing.     Assessment     Assessment HTN HSV  PLAN: Lower abdominal discomfort: Etiology not completely clear, better after BM or urination?  Does have occasional diarrhea.  Had LUTS few weeks ago but that is largely resolved. Udip within normal needs Had a colonoscopy 2017, + polyps. Plan: CMP, CBC, PSA, UA, urine culture. If all normal will recommend observation for a couple of weeks, if not better >>> GI referral?  This visit occurred during the SARS-CoV-2 public health emergency.  Safety protocols were in place, including screening questions prior to the visit, additional usage of staff PPE, and extensive cleaning of exam room while observing appropriate contact time as indicated for disinfecting solutions.

## 2019-12-26 NOTE — Patient Instructions (Signed)
  GO TO THE LAB : Get the blood work     

## 2019-12-27 LAB — URINE CULTURE
MICRO NUMBER:: 10709613
Result:: NO GROWTH
SPECIMEN QUALITY:: ADEQUATE

## 2019-12-28 NOTE — Assessment & Plan Note (Signed)
Lower abdominal discomfort: Etiology not completely clear, better after BM or urination?  Does have occasional diarrhea.  Had LUTS few weeks ago but that is largely resolved. Udip within normal needs Had a colonoscopy 2017, + polyps. Plan: CMP, CBC, PSA, UA, urine culture. If all normal will recommend observation for a couple of weeks, if not better >>> GI referral?

## 2020-02-17 ENCOUNTER — Encounter: Payer: Self-pay | Admitting: Internal Medicine

## 2020-07-14 ENCOUNTER — Encounter: Payer: BC Managed Care – PPO | Admitting: Internal Medicine

## 2020-07-17 ENCOUNTER — Ambulatory Visit (INDEPENDENT_AMBULATORY_CARE_PROVIDER_SITE_OTHER): Payer: BC Managed Care – PPO | Admitting: Internal Medicine

## 2020-07-17 ENCOUNTER — Encounter: Payer: Self-pay | Admitting: Internal Medicine

## 2020-07-17 ENCOUNTER — Other Ambulatory Visit: Payer: Self-pay

## 2020-07-17 VITALS — BP 133/84 | HR 82 | Temp 98.2°F | Resp 16 | Ht 72.0 in | Wt 190.4 lb

## 2020-07-17 DIAGNOSIS — E559 Vitamin D deficiency, unspecified: Secondary | ICD-10-CM | POA: Diagnosis not present

## 2020-07-17 DIAGNOSIS — Z1159 Encounter for screening for other viral diseases: Secondary | ICD-10-CM

## 2020-07-17 DIAGNOSIS — Z1211 Encounter for screening for malignant neoplasm of colon: Secondary | ICD-10-CM

## 2020-07-17 DIAGNOSIS — Z23 Encounter for immunization: Secondary | ICD-10-CM

## 2020-07-17 DIAGNOSIS — I10 Essential (primary) hypertension: Secondary | ICD-10-CM

## 2020-07-17 DIAGNOSIS — Z0000 Encounter for general adult medical examination without abnormal findings: Secondary | ICD-10-CM

## 2020-07-17 NOTE — Patient Instructions (Signed)
Check the  blood pressure monthly BP GOAL is between 110/65 and  135/85. If it is consistently higher or lower, let me know    GO TO THE LAB : Get the blood work     [place], [place] back for your next Shingrix dose in 2 months  Come back for a physical exam in 1 year

## 2020-07-17 NOTE — Progress Notes (Signed)
Pre visit review using our clinic review tool, if applicable. No additional management support is needed unless otherwise documented below in the visit note. 

## 2020-07-17 NOTE — Progress Notes (Signed)
   Subjective:    Patient ID: Larry Burgess, male    DOB: 07/28/1965, 55 y.o.   MRN: [id number]  DOS:  07/17/2020 Type of visit - description: CPX Since the last office visit is doing well.  Review of Systems  A 14 point review of systems is negative    Past Medical History:  Diagnosis Date  . Herpes   . Hypertension     Past Surgical History:  Procedure Laterality Date  . HERNIA REPAIR  1995    Allergies as of 07/17/2020   No Known Allergies     Medication List       Accurate as of July 17, 2020 11:59 PM. If you have any questions, ask your nurse or doctor.        amLODipine 10 MG tablet Commonly known as: NORVASC Take 1 tablet (10 mg total) by mouth daily.   valACYclovir 500 MG tablet Commonly known as: VALTREX Take 1 tablet (500 mg total) by mouth 2 (two) times daily as needed.          Objective:   Physical Exam BP 133/84 (BP Location: Right Arm, Patient Position: Sitting, Cuff Size: Normal)   Pulse 82   Temp 98.2 F (36.8 C) (Oral)   Resp 16   Ht 6' (1.829 m)   Wt 190 lb 6 oz (86.4 kg)   SpO2 96%   BMI 25.82 kg/m  General: Well developed, NAD, BMI noted Neck: No  thyromegaly  HEENT:  Normocephalic . Face symmetric, atraumatic Lungs:  CTA B Normal respiratory effort, no intercostal retractions, no accessory muscle use. Heart: RRR,  no murmur.  Abdomen:  Not distended, soft, non-tender. No rebound or rigidity.   Lower extremities: no pretibial edema bilaterally  Skin: Exposed areas without rash. Not pale. Not jaundice Neurologic:  alert & oriented X3.  Speech normal, gait appropriate for age and unassisted Strength symmetric and appropriate for age.  Psych: Cognition and judgment appear intact.  Cooperative with normal attention span and concentration.  Behavior appropriate. No anxious or depressed appearing.     Assessment    Assessment HTN HSV  PLAN: Here for CPX HTN: BP today is very good, has checked a couple times and  it was okay, continue amlodipine, recommend to check BPs from time to time. HSV: Controlled on Valtrex Request vitamins to recheck, he takes MV tablet occasionally RTC 1 year   This visit occurred during the SARS-CoV-2 public health emergency.  Safety protocols were in place, including screening questions prior to the visit, additional usage of staff PPE, and extensive cleaning of exam room while observing appropriate contact time as indicated for disinfecting solutions.

## 2020-07-19 ENCOUNTER — Encounter: Payer: Self-pay | Admitting: Internal Medicine

## 2020-07-19 NOTE — Assessment & Plan Note (Signed)
-  Td1/2020 - shingrix d/w pt: #1 today, next in 2 months -COVID vaccine x3 -Declined flu shot again - CCS: Colon cancer screening: Cscope 08-2015, 5 years, refer to GI -prostate ca screening: DRE and PSA normal -Diet-exercise discussed -Labs B09, vitamin D, folic acid, FLP, hep C, BMP

## 2020-07-19 NOTE — Assessment & Plan Note (Signed)
Here for CPX HTN: BP today is very good, has checked a couple times and it was okay, continue amlodipine, recommend to check BPs from time to time. HSV: Controlled on Valtrex Request vitamins to recheck, he takes MV tablet occasionally RTC 1 year

## 2020-07-20 LAB — LIPID PANEL
Cholesterol: 189 mg/dL (ref <200)
HDL: 44 mg/dL (ref >=40)
LDL Cholesterol (Calc): 113 mg/dL (calc) — ABNORMAL HIGH
Non-HDL Cholesterol (Calc): 145 mg/dL (calc) — ABNORMAL HIGH (ref <130)
Total CHOL/HDL Ratio: 4.3 (calc) (ref <5.0)
Triglycerides: 203 mg/dL — ABNORMAL HIGH (ref <150)

## 2020-07-20 LAB — BASIC METABOLIC PANEL
BUN: 11 mg/dL (ref 7–25)
CO2: 27 mmol/L (ref 20–32)
Calcium: 9.3 mg/dL (ref 8.6–10.3)
Chloride: 99 mmol/L (ref 98–110)
Creat: 0.86 mg/dL (ref 0.70–1.33)
Glucose, Bld: 82 mg/dL (ref 65–99)
Potassium: 4.2 mmol/L (ref 3.5–5.3)
Sodium: 138 mmol/L (ref 135–146)

## 2020-07-20 LAB — B12 AND FOLATE PANEL
Folate: 17.1 ng/mL
Vitamin B-12: 352 pg/mL (ref 200–1100)

## 2020-07-20 LAB — VITAMIN D 25 HYDROXY (VIT D DEFICIENCY, FRACTURES): Vit D, 25-Hydroxy: 28 ng/mL — ABNORMAL LOW (ref 30–100)

## 2020-07-20 LAB — HEPATITIS C ANTIBODY
Hepatitis C Ab: NONREACTIVE
SIGNAL TO CUT-OFF: 0.01 (ref <1.00)

## 2020-07-20 MED ORDER — VITAMIN D (ERGOCALCIFEROL) 1.25 MG (50000 UNIT) PO CAPS: 50000.0000 [IU] | ORAL_CAPSULE | ORAL | 0 refills | Status: DC

## 2020-07-20 NOTE — Addendum Note (Signed)
Addended byDamita Dunnings D on: 07/20/2020 09:17 AM   Modules accepted: Orders

## 2020-07-27 ENCOUNTER — Encounter: Payer: Self-pay | Admitting: Internal Medicine

## 2020-07-28 DIAGNOSIS — E559 Vitamin D deficiency, unspecified: Secondary | ICD-10-CM | POA: Insufficient documentation

## 2020-08-07 ENCOUNTER — Other Ambulatory Visit: Payer: Self-pay | Admitting: Internal Medicine

## 2020-09-02 ENCOUNTER — Other Ambulatory Visit: Payer: Self-pay

## 2020-09-02 ENCOUNTER — Ambulatory Visit (AMBULATORY_SURGERY_CENTER): Payer: Self-pay

## 2020-09-02 VITALS — Ht 72.0 in | Wt 185.0 lb

## 2020-09-02 DIAGNOSIS — Z86010 Personal history of colonic polyps: Secondary | ICD-10-CM

## 2020-09-02 MED ORDER — PLENVU 140 G PO SOLR: 1.0000 | ORAL | 0 refills | Status: DC

## 2020-09-02 NOTE — Progress Notes (Signed)
No egg or soy allergy known to patient  No issues with past sedation with any surgeries or procedures Patient denies ever being told they had issues or difficulty with intubation  No FH of Malignant Hyperthermia No diet pills per patient No home 02 use per patient  No blood thinners per patient  Pt denies issues with constipation  No A fib or A flutter  EMMI video via MyChart  COVID 19 guidelines implemented in PV today with Pt and RN  Pt is fully vaccinated for Covid x 2 +booster; Coupon given to pt in PV today , Code to Pharmacy and  NO PA's for preps discussed with pt In PV today  Discussed with pt there will be an out-of-pocket cost for prep and that varies from $0 to 70 dollars  Due to the COVID-19 pandemic we are asking patients to follow certain guidelines.    Pt aware of COVID protocols and [facility] guidelines

## 2020-09-05 ENCOUNTER — Other Ambulatory Visit: Payer: Self-pay | Admitting: Internal Medicine

## 2020-09-10 ENCOUNTER — Other Ambulatory Visit: Payer: Self-pay | Admitting: Internal Medicine

## 2020-09-10 ENCOUNTER — Encounter: Payer: Self-pay | Admitting: Internal Medicine

## 2020-09-10 DIAGNOSIS — Z86010 Personal history of colonic polyps: Secondary | ICD-10-CM

## 2020-09-10 NOTE — Telephone Encounter (Signed)
[facility]-  [facility] was the preferred prep per [facility]- Sent in [facility] script- did my chart instructions to pt- called pt- he is aware of the changes and will call with questions about the movi prep instructions

## 2020-09-14 ENCOUNTER — Ambulatory Visit (AMBULATORY_SURGERY_CENTER): Payer: BC Managed Care – PPO | Admitting: Internal Medicine

## 2020-09-14 ENCOUNTER — Encounter: Payer: Self-pay | Admitting: Internal Medicine

## 2020-09-14 ENCOUNTER — Other Ambulatory Visit: Payer: Self-pay

## 2020-09-14 VITALS — BP 126/74 | HR 77 | Temp 97.5°F | Resp 18 | Ht 72.0 in | Wt 185.0 lb

## 2020-09-14 DIAGNOSIS — D124 Benign neoplasm of descending colon: Secondary | ICD-10-CM

## 2020-09-14 DIAGNOSIS — Z86010 Personal history of colonic polyps: Secondary | ICD-10-CM

## 2020-09-14 DIAGNOSIS — Z1211 Encounter for screening for malignant neoplasm of colon: Secondary | ICD-10-CM

## 2020-09-14 MED ORDER — SODIUM CHLORIDE 0.9 % IV SOLN: 500.0000 mL | Freq: Once | INTRAVENOUS | Status: DC

## 2020-09-14 NOTE — Progress Notes (Signed)
VS taken by Garber 

## 2020-09-14 NOTE — Op Note (Signed)
[facility] Patient Name: Larry Burgess Procedure Date: 09/14/2020 11:20 AM MRN: [id number] Endoscopist: Jerene Bears , MD Age: 55 Referring MD:  Date of Birth: 10/01/1965 Gender: Male Account #: [id number] Procedure:                Colonoscopy Indications:              High risk colon cancer surveillance: Personal                            history of non-advanced adenoma, Last colonoscopy:                            March 2017 Medicines:                Monitored Anesthesia Care Procedure:                Pre-Anesthesia Assessment:                           - Prior to the procedure, a History and Physical                            was performed, and patient medications and                            allergies were reviewed. The patient's tolerance of                            previous anesthesia was also reviewed. The risks                            and benefits of the procedure and the sedation                            options and risks were discussed with the patient.                            All questions were answered, and informed consent                            was obtained. Prior Anticoagulants: The patient has                            taken no previous anticoagulant or antiplatelet                            agents. ASA Grade Assessment: II - A patient with                            mild systemic disease. After reviewing the risks                            and benefits, the patient was deemed in  satisfactory condition to undergo the procedure.                           After obtaining informed consent, the colonoscope                            was passed under direct vision. Throughout the                            procedure, the patient's blood pressure, pulse, and                            oxygen saturations were monitored continuously. The                            Olympus CF-HQ190L [id number]) Colonoscope was                             introduced through the anus and advanced to the                            cecum, identified by appendiceal orifice and                            ileocecal valve. The colonoscopy was performed                            without difficulty. The patient tolerated the                            procedure well. The quality of the bowel                            preparation was good. The ileocecal valve,                            appendiceal orifice, and rectum were photographed. Scope In: 11:29:50 AM Scope Out: 11:45:57 AM Scope Withdrawal Time: 0 hours 13 minutes 24 seconds  Total Procedure Duration: 0 hours 16 minutes 7 seconds  Findings:                 The digital rectal exam was normal.                           A 4 mm polyp was found in the proximal descending                            colon. The polyp was sessile. The polyp was removed                            with a cold snare. Resection and retrieval were                            complete.  A few small-mouthed diverticula were found in the                            transverse colon.                           The exam was otherwise without abnormality on                            direct and retroflexion views. Complications:            No immediate complications. Estimated Blood Loss:     Estimated blood loss: none. Impression:               - One 4 mm polyp in the proximal descending colon,                            removed with a cold snare. Resected and retrieved.                           - Diverticulosis in the transverse colon.                           - The examination was otherwise normal on direct                            and retroflexion views. Recommendation:           - Patient has a contact number available for                            emergencies. The signs and symptoms of potential                            delayed complications were discussed with the                             patient. Return to normal activities tomorrow.                            Written discharge instructions were provided to the                            patient.                           - Resume previous diet.                           - Continue present medications.                           - Await pathology results.                           - Repeat colonoscopy is recommended for  surveillance. The colonoscopy date will be                            determined after pathology results from today's                            exam become available for review. Jerene Bears, MD 09/14/2020 11:48:27 AM This report has been signed electronically.

## 2020-09-14 NOTE — Progress Notes (Signed)
Report given to PACU, vss 

## 2020-09-14 NOTE — Progress Notes (Signed)
Called to room to assist during endoscopic procedure.  Patient ID and intended procedure confirmed with present staff. Received instructions for my participation in the procedure from the performing physician.  

## 2020-09-14 NOTE — Progress Notes (Signed)
Pt's states no medical or surgical changes since previsit or office visit. 

## 2020-09-14 NOTE — Patient Instructions (Signed)
Handouts provided on polyps and diverticulosis.  ? ?YOU HAD AN ENDOSCOPIC PROCEDURE TODAY AT THE [facility]:   Refer to the procedure report that was given to you for any specific questions about what was found during the examination.  If the procedure report does not answer your questions, please call your gastroenterologist to clarify.  If you requested that your care partner not be given the details of your procedure findings, then the procedure report has been included in a sealed envelope for you to review at your convenience later. ? ?YOU SHOULD EXPECT: Some feelings of bloating in the abdomen. Passage of more gas than usual.  Walking can help get rid of the air that was put into your GI tract during the procedure and reduce the bloating. If you had a lower endoscopy (such as a colonoscopy or flexible sigmoidoscopy) you may notice spotting of blood in your stool or on the toilet paper. If you underwent a bowel prep for your procedure, you may not have a normal bowel movement for a few days. ? ?Please Note:  You might notice some irritation and congestion in your nose or some drainage.  This is from the oxygen used during your procedure.  There is no need for concern and it should clear up in a day or so. ? ?SYMPTOMS TO REPORT IMMEDIATELY: ? ?Following lower endoscopy (colonoscopy or flexible sigmoidoscopy): ? Excessive amounts of blood in the stool ? Significant tenderness or worsening of abdominal pains ? Swelling of the abdomen that is new, acute ? Fever of 100?F or higher ? ?For urgent or emergent issues, a gastroenterologist can be reached at any hour by calling [phone]. ?Do not use MyChart messaging for urgent concerns.  ? ? ?DIET:  We do recommend a small meal at first, but then you may proceed to your regular diet.  Drink plenty of fluids but you should avoid alcoholic beverages for 24 hours. ? ?ACTIVITY:  You should plan to take it easy for the rest of today and you should NOT  DRIVE or use heavy machinery until tomorrow (because of the sedation medicines used during the test).   ? ?FOLLOW UP: ?Our staff will call the number listed on your records 48-72 hours following your procedure to check on you and address any questions or concerns that you may have regarding the information given to you following your procedure. If we do not reach you, we will leave a message.  We will attempt to reach you two times.  During this call, we will ask if you have developed any symptoms of COVID 19. If you develop any symptoms (ie: fever, flu-like symptoms, shortness of breath, cough etc.) before then, please call [phone].  If you test positive for Covid 19 in the 2 weeks post procedure, please call and report this information to us.   ? ?If any biopsies were taken you will be contacted by phone or by letter within the next 1-3 weeks.  Please call us at [phone] if you have not heard about the biopsies in 3 weeks.  ? ? ?SIGNATURES/CONFIDENTIALITY: ?You and/or your care partner have signed paperwork which will be entered into your electronic medical record.  These signatures attest to the fact that that the information above on your After Visit Summary has been reviewed and is understood.  Full responsibility of the confidentiality of this discharge information lies with you and/or your care-partner. ? ?

## 2020-09-15 ENCOUNTER — Ambulatory Visit (INDEPENDENT_AMBULATORY_CARE_PROVIDER_SITE_OTHER): Payer: BC Managed Care – PPO

## 2020-09-15 DIAGNOSIS — Z23 Encounter for immunization: Secondary | ICD-10-CM | POA: Diagnosis not present

## 2020-09-15 NOTE — Progress Notes (Signed)
Larry Burgess is a 55 y.o. male presents to the office today for second shingrix injection, per physician's orders. Original order: 07/17/2020 shingrix (med), 0.35mL (dose), IM (route) was administered right deltoid(location) today. Patient tolerated injection. Loleta Chance

## 2020-09-16 ENCOUNTER — Telehealth: Payer: Self-pay

## 2020-09-16 NOTE — Telephone Encounter (Signed)
  Follow up Call-  Call back number 09/14/2020  Post procedure Call Back phone  # [phone]  Permission to leave phone message Yes  Some recent data might be hidden     Patient questions:  Do you have a fever, pain , or abdominal swelling? No. Pain Score  0 *  Have you tolerated food without any problems? Yes.    Have you been able to return to your normal activities? Yes.    Do you have any questions about your discharge instructions: Diet   No. Medications  No. Follow up visit  No.  Do you have questions or concerns about your Care? No.  Actions: * If pain score is 4 or above: No action needed, pain <4. 1. Have you developed a fever since your procedure? no  2.   Have you had an respiratory symptoms (SOB or cough) since your procedure? no  3.   Have you tested positive for COVID 19 since your procedure no  4.   Have you had any family members/close contacts diagnosed with the COVID 19 since your procedure?  no   If yes to any of these questions please route to Joylene John, RN and Joella Prince, RN

## 2020-09-17 ENCOUNTER — Encounter: Payer: Self-pay | Admitting: Internal Medicine

## 2020-10-01 ENCOUNTER — Other Ambulatory Visit: Payer: Self-pay | Admitting: Internal Medicine

## 2021-01-15 ENCOUNTER — Ambulatory Visit (INDEPENDENT_AMBULATORY_CARE_PROVIDER_SITE_OTHER): Payer: BC Managed Care – PPO | Admitting: Internal Medicine

## 2021-01-15 ENCOUNTER — Other Ambulatory Visit: Payer: Self-pay

## 2021-01-15 VITALS — BP 162/100 | HR 80 | Temp 98.4°F | Resp 16 | Ht 72.0 in | Wt 178.0 lb

## 2021-01-15 DIAGNOSIS — N1330 Unspecified hydronephrosis: Secondary | ICD-10-CM | POA: Diagnosis not present

## 2021-01-15 DIAGNOSIS — N201 Calculus of ureter: Secondary | ICD-10-CM

## 2021-01-15 DIAGNOSIS — N200 Calculus of kidney: Secondary | ICD-10-CM | POA: Diagnosis not present

## 2021-01-15 DIAGNOSIS — R1031 Right lower quadrant pain: Secondary | ICD-10-CM | POA: Diagnosis not present

## 2021-01-15 LAB — POC URINALSYSI DIPSTICK (AUTOMATED)
Bilirubin, UA: NEGATIVE
Blood, UA: NEGATIVE
Glucose, UA: NEGATIVE
Ketones, UA: NEGATIVE
Leukocytes, UA: NEGATIVE
Nitrite, UA: NEGATIVE
Protein, UA: POSITIVE — AB
Spec Grav, UA: 1.025 (ref 1.010–1.025)
Urobilinogen, UA: 0.2 E.U./dL
pH, UA: 5.5 (ref 5.0–8.0)

## 2021-01-15 NOTE — Patient Instructions (Signed)
Please go to the first floor and arrange a CAT scan  Until then, drink plenty of fluids, rest, go to the ER if: Fever chills, severe pain, nausea vomiting.

## 2021-01-15 NOTE — Progress Notes (Signed)
Subjective:    Patient ID: Larry Burgess, male    DOB: 04-01-1966, 55 y.o.   MRN: [id number]  DOS:  01/15/2021 Type of visit - description: Acute On and off symptoms for 2 weeks, vaguely described. Most of the pain is at the right lower back, it radiates anteriorly to the right lower quadrant of the abdomen. Episodes last 10 or 20 minutes. No pain, bleeding, sometimes pain increased postprandially. Sometimes pain decreases with a bowel movement. Symptoms are not clearly positional, unclear if they change when he twists his torso.  No fever chills Appetite is okay No nausea, vomiting, diarrhea.  No blood in the stools No dysuria or gross hematuria.  No difficulty urinating No cough, no weight loss    Review of Systems See above   Past Medical History:  Diagnosis Date   Herpes    Hypertension    on meds    Past Surgical History:  Procedure Laterality Date   COLONOSCOPY  2017   JMP-MAC-suprep(good)-TA   LAPAROSCOPIC INGUINAL HERNIA REPAIR Bilateral 1995   with mesh   WISDOM TOOTH EXTRACTION      Allergies as of 01/15/2021   No Known Allergies      Medication List        Accurate as of January 15, 2021  4:10 PM. If you have any questions, ask your nurse or doctor.          amLODipine 10 MG tablet Commonly known as: NORVASC Take 1 tablet (10 mg total) by mouth daily.   valACYclovir 500 MG tablet Commonly known as: VALTREX Take 1 tablet (500 mg total) by mouth 2 (two) times daily as needed.   Vitamin D (Ergocalciferol) 1.25 MG (50000 UNIT) Caps capsule Commonly known as: DRISDOL Take 1 capsule (50,000 Units total) by mouth every 7 (seven) days.           Objective:   Physical Exam BP (!) 162/100 (BP Location: Left Arm, Patient Position: Sitting, Cuff Size: Small)   Pulse 80   Temp 98.4 F (36.9 C) (Oral)   Resp 16   Ht 6' (1.829 m)   Wt 178 lb (80.7 kg)   SpO2 98%   BMI 24.14 kg/m  General:   Well developed, NAD, BMI noted.  HEENT:   Normocephalic . Face symmetric, atraumatic Lungs:  CTA B Normal respiratory effort, no intercostal retractions, no accessory muscle use. Heart: RRR,  no murmur.  Abdomen:  Not distended, soft, mild tenderness without mass or rebound at deep palpation of the RLQ. No CVA tenderness MSK: No TTP at the thoracic spine. Skin: Not pale. Not jaundice Lower extremities: no pretibial edema bilaterally  Neurologic:  alert & oriented X3.  Speech normal, gait appropriate for age and unassisted Psych--  Cognition and judgment appear intact.  Cooperative with normal attention span and concentration.  Behavior appropriate. No anxious or depressed appearing.     Assessment     Assessment HTN HSV  PLAN: Right low back pain, RLQ abdominal pain. Udip (-) Pain is somewhat atypical for any specific diagnosis.  The patient is concerned about this being his appendix or gallbladder. DDx based on symptoms could be MSK -although there is no classic feature for such-, retrocecal appendix, gallbladder stones, colitis, mild diverticulitis, kidney stones (but udip neg and no luts), etc. My lab is closed, I cannot get blood work now. Plan:  UA UCX CT abdomen, rule out above conditions.  Further advised with results, on-call doctor notified.  ER if  symptoms severe, see AVS. Addendum: CT: R stone, see report, rec fluids, flomax, OTCs for pain, hydrocodone if needed, refer to urology, pt aware   This visit occurred during the SARS-CoV-2 public health emergency.  Safety protocols were in place, including screening questions prior to the visit, additional usage of staff PPE, and extensive cleaning of exam room while observing appropriate contact time as indicated for disinfecting solutions.

## 2021-01-16 ENCOUNTER — Ambulatory Visit (HOSPITAL_BASED_OUTPATIENT_CLINIC_OR_DEPARTMENT_OTHER)
Admission: RE | Admit: 2021-01-16 | Discharge: 2021-01-16 | Disposition: A | Discharge status: Home / Self Care | Payer: BC Managed Care – PPO | Source: Ambulatory Visit | Attending: Internal Medicine | Admitting: Internal Medicine

## 2021-01-16 DIAGNOSIS — I700 Atherosclerosis of aorta: Secondary | ICD-10-CM | POA: Diagnosis not present

## 2021-01-16 DIAGNOSIS — N132 Hydronephrosis with renal and ureteral calculous obstruction: Secondary | ICD-10-CM | POA: Diagnosis not present

## 2021-01-16 DIAGNOSIS — R1031 Right lower quadrant pain: Secondary | ICD-10-CM | POA: Insufficient documentation

## 2021-01-16 LAB — URINALYSIS, ROUTINE W REFLEX MICROSCOPIC
Bilirubin Urine: NEGATIVE
Glucose, UA: NEGATIVE
Hgb urine dipstick: NEGATIVE
Ketones, ur: NEGATIVE
Leukocytes,Ua: NEGATIVE
Nitrite: NEGATIVE
Protein, ur: NEGATIVE
Specific Gravity, Urine: 1.021 (ref 1.001–1.035)
pH: <=5 (ref 5.0–8.0)

## 2021-01-16 LAB — URINE CULTURE
MICRO NUMBER:: 12207437
Result:: NO GROWTH
SPECIMEN QUALITY:: ADEQUATE

## 2021-01-16 MED ORDER — TRIAMCINOLONE ACETONIDE 0.1 % EX CREA: TOPICAL_CREAM | Freq: Three times a day (TID) | CUTANEOUS | 2 refills | Status: AC

## 2021-01-16 MED ORDER — TAMSULOSIN HCL 0.4 MG PO CAPS: 0.4000 mg | ORAL_CAPSULE | Freq: Every day | ORAL | 1 refills | Status: DC

## 2021-01-16 MED ORDER — HYDROCODONE-ACETAMINOPHEN 5-325 MG PO TABS: 1.0000 | ORAL_TABLET | Freq: Three times a day (TID) | ORAL | 0 refills | Status: DC | PRN

## 2021-01-17 NOTE — Assessment & Plan Note (Signed)
Right low back pain, RLQ abdominal pain. Udip (-) Pain is somewhat atypical for any specific diagnosis.  The patient is concerned about this being his appendix or gallbladder. DDx based on symptoms could be MSK -although there is no classic feature for such-, retrocecal appendix, gallbladder stones, colitis, mild diverticulitis, kidney stones (but udip neg and no luts), etc. My lab is closed, I cannot get blood work now. Plan:  UA UCX CT abdomen, rule out above conditions.  Further advised with results, on-call doctor notified.  ER if symptoms severe, see AVS. Addendum: CT: R stone, see report, rec fluids, flomax, OTCs for pain, hydrocodone if needed, refer to urology, pt aware

## 2021-01-18 NOTE — Addendum Note (Signed)
Addended byDamita Dunnings D on: 01/18/2021 07:44 AM   Modules accepted: Orders

## 2021-01-21 ENCOUNTER — Other Ambulatory Visit: Payer: Self-pay | Admitting: Urology

## 2021-01-21 DIAGNOSIS — N201 Calculus of ureter: Secondary | ICD-10-CM | POA: Diagnosis not present

## 2021-01-22 NOTE — Progress Notes (Signed)
Called pt back went to voice mail . Left message for pt to call back

## 2021-01-22 NOTE — Progress Notes (Signed)
Called pt asked to call back after 1000. Has a meeting until 1000

## 2021-01-22 NOTE — Progress Notes (Signed)
Pt called back instructions given. Meds and history review. Wife is the driver. Arrival time 0645 cl liquids until 0445

## 2021-01-25 ENCOUNTER — Encounter (HOSPITAL_BASED_OUTPATIENT_CLINIC_OR_DEPARTMENT_OTHER)
Admission: RE | Disposition: A | Discharge status: Home / Self Care | Payer: Self-pay | Source: Home / Self Care | Attending: Urology

## 2021-01-25 ENCOUNTER — Ambulatory Visit (HOSPITAL_COMMUNITY): Payer: BC Managed Care – PPO

## 2021-01-25 ENCOUNTER — Other Ambulatory Visit: Payer: Self-pay

## 2021-01-25 ENCOUNTER — Encounter (HOSPITAL_BASED_OUTPATIENT_CLINIC_OR_DEPARTMENT_OTHER): Payer: Self-pay | Admitting: Urology

## 2021-01-25 ENCOUNTER — Ambulatory Visit (HOSPITAL_BASED_OUTPATIENT_CLINIC_OR_DEPARTMENT_OTHER)
Admission: RE | Admit: 2021-01-25 | Discharge: 2021-01-25 | Disposition: A | Discharge status: Home / Self Care | Payer: BC Managed Care – PPO | Attending: Urology | Admitting: Urology

## 2021-01-25 DIAGNOSIS — M4186 Other forms of scoliosis, lumbar region: Secondary | ICD-10-CM | POA: Diagnosis not present

## 2021-01-25 DIAGNOSIS — N201 Calculus of ureter: Secondary | ICD-10-CM | POA: Insufficient documentation

## 2021-01-25 DIAGNOSIS — Z8719 Personal history of other diseases of the digestive system: Secondary | ICD-10-CM | POA: Diagnosis not present

## 2021-01-25 DIAGNOSIS — I10 Essential (primary) hypertension: Secondary | ICD-10-CM | POA: Diagnosis not present

## 2021-01-25 DIAGNOSIS — M47816 Spondylosis without myelopathy or radiculopathy, lumbar region: Secondary | ICD-10-CM | POA: Diagnosis not present

## 2021-01-25 HISTORY — PX: EXTRACORPOREAL SHOCK WAVE LITHOTRIPSY: SHX1557

## 2021-01-25 SURGERY — LITHOTRIPSY, ESWL
Anesthesia: LOCAL | Laterality: Right

## 2021-01-25 MED ORDER — TRAMADOL HCL 50 MG PO TABS: 50.0000 mg | ORAL_TABLET | Freq: Four times a day (QID) | ORAL | 0 refills | Status: DC | PRN

## 2021-01-25 MED ORDER — DIPHENHYDRAMINE HCL 25 MG PO CAPS
25.0000 mg | ORAL_CAPSULE | ORAL | Status: AC
  Administered 2021-01-25: 25 mg via ORAL

## 2021-01-25 MED ORDER — TAMSULOSIN HCL 0.4 MG PO CAPS: 0.4000 mg | ORAL_CAPSULE | Freq: Every day | ORAL | 0 refills | Status: DC

## 2021-01-25 MED ORDER — DIPHENHYDRAMINE HCL 25 MG PO CAPS
ORAL_CAPSULE | ORAL | Status: AC
  Filled 2021-01-25: qty 1

## 2021-01-25 MED ORDER — CIPROFLOXACIN HCL 500 MG PO TABS
ORAL_TABLET | ORAL | Status: AC
  Filled 2021-01-25: qty 1

## 2021-01-25 MED ORDER — SODIUM CHLORIDE 0.9 % IV SOLN: INTRAVENOUS | Status: DC

## 2021-01-25 MED ORDER — DIAZEPAM 5 MG PO TABS
10.0000 mg | ORAL_TABLET | ORAL | Status: AC
  Administered 2021-01-25: 10 mg via ORAL

## 2021-01-25 MED ORDER — DIAZEPAM 5 MG PO TABS
ORAL_TABLET | ORAL | Status: AC
  Filled 2021-01-25: qty 2

## 2021-01-25 MED ORDER — CIPROFLOXACIN HCL 500 MG PO TABS
500.0000 mg | ORAL_TABLET | ORAL | Status: AC
  Administered 2021-01-25: 500 mg via ORAL

## 2021-01-25 NOTE — Discharge Instructions (Signed)
See [facility] discharge instructions in chart.  

## 2021-01-25 NOTE — Interval H&P Note (Signed)
History and Physical Interval Note:  01/25/2021 9:29 AM  Larry Burgess  has presented today for surgery, with the diagnosis of RIGHT URETERAL STONE.  The various methods of treatment have been discussed with the patient and family. After consideration of risks, benefits and other options for treatment, the patient has consented to  Procedure(s): EXTRACORPOREAL SHOCK WAVE LITHOTRIPSY (ESWL) (Right) as a surgical intervention.  The patient's history has been reviewed, patient examined, no change in status, stable for surgery.  I have reviewed the patient's chart and labs.  Questions were answered to the patient's satisfaction.     Ardis Hughs

## 2021-01-25 NOTE — Op Note (Signed)
See [facility] OP note scanned into chart. Also because of the size, density, location and other factors that cannot be anticipated I feel this will likely be a staged procedure. This fact supersedes any indication in the scanned [facility] operative note to the contrary.  

## 2021-01-25 NOTE — H&P (Signed)
I have pain in the flank.  HPI: Larry Burgess is a 55 year-old male patient who was referred by Dr. Kathlene November, MD who is here for flank pain.  The problem is on the right side. His pain started about approximately 01/14/2021. The pain is sharp. The intensity of his pain is rated as a 1. The pain is intermittent. The pain does not radiate.   None< makes the pain better. Nothing causes the pain to become worse. He has not been treated with any pain medications.   He has not had this same pain previously. He has not had kidney stones.   -6 mm right mid-ureteral stone with mild hydronephrosis seen on CT from 01/16/21  -Pain level is low today but has been painful  -No hx of kidney stones     AUA Symptom Score: He never has the sensation of not emptying his bladder completely after finishing urinating. He never has to urinate again less that two hours after he has finished urinating. He does not have to stop and start again several times when he urinates. He never finds it difficult to postpone urination. Less than 20% of the time he has a weak urinary stream. He never has to push or strain to begin urination. He has to get up to urinate 1 time from the time he goes to bed until the time he gets up in the morning.   Calculated AUA Symptom Score: 2    ALLERGIES: No Allergies    MEDICATIONS: Tamsulosin Hcl 0.4 mg capsule  Amlodipine Besylate 10 mg tablet  Valtrex     GU PSH: None   NON-GU PSH: Hernia Repair, x2 (1995)- double hernia     GU PMH: None   NON-GU PMH: GERD Hypertension    FAMILY HISTORY: No Family History    SOCIAL HISTORY: Marital Status: Married Preferred Language: English; Ethnicity: Not Hispanic Or Latino; Race: White Current Smoking Status: Patient has never smoked.   Tobacco Use Assessment Completed: Used Tobacco in last 30 days? Social Drinker.  Drinks 1 caffeinated drink per day. Patient's occupation [employer].    REVIEW OF SYSTEMS:    GU Review Male:    Patient reports get up at night to urinate. Patient denies frequent urination, hard to postpone urination, burning/ pain with urination, leakage of urine, stream starts and stops, trouble starting your stream, have to strain to urinate , erection problems, and penile pain.  Gastrointestinal (Upper):   Patient denies nausea, vomiting, and indigestion/ heartburn.  Gastrointestinal (Lower):   Patient denies diarrhea and constipation.  Constitutional:   Patient reports night sweats. Patient denies fever, weight loss, and fatigue.  Skin:   Patient reports itching. Patient denies skin rash/ lesion.  Eyes:   Patient denies blurred vision and double vision.  Ears/ Nose/ Throat:   Patient denies sore throat and sinus problems.  Hematologic/Lymphatic:   Patient denies swollen glands and easy bruising.  Cardiovascular:   Patient denies leg swelling and chest pains.  Respiratory:   Patient denies cough and shortness of breath.  Endocrine:   Patient denies excessive thirst.  Musculoskeletal:   Patient denies joint pain and back pain.  Neurological:   Patient denies headaches and dizziness.  Psychologic:   Patient denies depression and anxiety.   VITAL SIGNS:      01/21/2021 09:29 AM  Weight 180 lb / 81.65 kg  Height 72 in / 182.88 cm  BP 160/84 mmHg  Pulse 102 /min  Temperature 97.7 F / 36.5  C  BMI 24.4 kg/m   MULTI-SYSTEM PHYSICAL EXAMINATION:    Constitutional: Well-nourished. No physical deformities. Normally developed. Good grooming.  Neurologic / Psychiatric: Oriented to time, oriented to place, oriented to person. No depression, no anxiety, no agitation.     Complexity of Data:  Records Review:   Previous Patient Records  X-Ray Review: [facility] Protocol: Reviewed Films. Reviewed Report. Discussed With Patient.    Notes:                     Narrative & Impression  CLINICAL DATA: 55 year old male with history of right-sided  abdominal and flank pain for the past 2-3 weeks.     EXAM:   CT ABDOMEN AND PELVIS WITHOUT CONTRAST     TECHNIQUE:  Multidetector CT imaging of the abdomen and pelvis was performed  following the standard protocol without IV contrast.     COMPARISON: No priors.     FINDINGS:  Lower chest: Unremarkable.     Hepatobiliary: No suspicious cystic or solid hepatic lesions are  confidently identified on today's noncontrast CT examination.  Gallbladder is nearly completely decompressed, but otherwise  unremarkable in appearance.     Pancreas: No definite pancreatic mass or peripancreatic fluid  collections or inflammatory changes are noted on today's noncontrast  CT examination.     Spleen: Unremarkable.     Adrenals/Urinary Tract: 6 mm calculus in the proximal third of the  right ureter shortly beyond the right ureteropelvic junction with  mild proximal right hydroureteronephrosis. No additional calculi are  identified in the collecting system of the left kidney, elsewhere  along the course of either ureter, or within the lumen of the  urinary bladder. No left hydroureteronephrosis. Exophytic 6.7 cm  low-attenuation lesion in the lower pole of the left kidney,  incompletely characterized on today's non-contrast CT examination,  but statistically likely to represent a cyst. Bilateral adrenal  glands are normal in appearance. Urinary bladder is normal in  appearance.     Stomach/Bowel: The appearance of the stomach is normal. There is no  pathologic dilatation of small bowel or colon. Normal appendix.     Vascular/Lymphatic: Aortic atherosclerosis. No lymphadenopathy noted  in the abdomen or pelvis.     Reproductive: Prostate gland and seminal vesicles are unremarkable  in appearance.     Other: Status post bilateral inguinal herniorrhaphy. No significant  volume of ascites. No pneumoperitoneum.     Musculoskeletal: There are no aggressive appearing lytic or blastic  lesions noted in the visualized portions of the skeleton.      IMPRESSION:  1. 6 mm obstructive calculus in the proximal third of the right  ureter shortly beyond the right ureteropelvic junction with mild  proximal right hydroureteronephrosis.  2. Aortic atherosclerosis.        Electronically Signed  By: Vinnie Langton M.D.  On: 01/16/2021 11:16      PROCEDURES:         KUB - [id number]  A single view of the abdomen is obtained.      Patient confirmed No Neulasta OnPro Device.   KUB today shows a 6 mm calcification along the expected course of the right mid ureter. No other calcifications are seen within the either renal shadow or along the expected course of the left ureter. No bladder calcifications are appreciated. No bony or bowel abnormalities are noted.          Urinalysis Dipstick Dipstick Cont'd  Color: Yellow Bilirubin: Neg mg/dL  Appearance:  Clear Ketones: Neg mg/dL  Specific Gravity: 1.020 Blood: Neg ery/uL  pH: <=5.0 Protein: Neg mg/dL  Glucose: Neg mg/dL Urobilinogen: 0.2 mg/dL    Nitrites: Neg    Leukocyte Esterase: Neg leu/uL    ASSESSMENT:      ICD-10 Details  1 GU:   Ureteral calculus - N20.1 Right, Undiagnosed New Problem - 6 mm right mid-ureteral stone--visible on scout   2   Flank Pain - R10.84 Right, Undiagnosed New Problem   PLAN:           Orders X-Rays: KUB          Schedule Return Visit/Planned Activity: Next Available Appointment - Schedule Surgery  Procedure: Unspecified Date - ESWL - 50590, right          Document Letter(s):  Created for Patient: Clinical Summary         Notes:   The risks, benefits and alternatives of RIGHT ESWL was discussed with the patient. I described the risks which include arrhythmia, kidney contusion, kidney hemorrhage, need for transfusion, back discomfort, flank ecchymosis, flank abrasion, inability to break up stone, inability to pass stone fragments, Steinstrasse, infection associated with obstructing stones, need for different surgical procedure and possible need for  repeat shockwave lithotripsy. The patient voices understanding and wishes to proceed.

## 2021-01-26 ENCOUNTER — Encounter (HOSPITAL_BASED_OUTPATIENT_CLINIC_OR_DEPARTMENT_OTHER): Payer: Self-pay | Admitting: Urology

## 2021-02-07 ENCOUNTER — Other Ambulatory Visit: Payer: Self-pay | Admitting: Internal Medicine

## 2021-02-25 DIAGNOSIS — R1084 Generalized abdominal pain: Secondary | ICD-10-CM | POA: Diagnosis not present

## 2021-02-25 DIAGNOSIS — N201 Calculus of ureter: Secondary | ICD-10-CM | POA: Diagnosis not present

## 2021-07-20 ENCOUNTER — Ambulatory Visit (INDEPENDENT_AMBULATORY_CARE_PROVIDER_SITE_OTHER): Payer: BC Managed Care – PPO | Admitting: Internal Medicine

## 2021-07-20 ENCOUNTER — Encounter: Payer: Self-pay | Admitting: Internal Medicine

## 2021-07-20 VITALS — BP 126/82 | HR 84 | Temp 97.9°F | Resp 16 | Ht 72.0 in | Wt 185.1 lb

## 2021-07-20 DIAGNOSIS — Z125 Encounter for screening for malignant neoplasm of prostate: Secondary | ICD-10-CM | POA: Diagnosis not present

## 2021-07-20 DIAGNOSIS — I10 Essential (primary) hypertension: Secondary | ICD-10-CM

## 2021-07-20 DIAGNOSIS — E559 Vitamin D deficiency, unspecified: Secondary | ICD-10-CM | POA: Diagnosis not present

## 2021-07-20 DIAGNOSIS — Z0000 Encounter for general adult medical examination without abnormal findings: Secondary | ICD-10-CM | POA: Diagnosis not present

## 2021-07-20 LAB — CBC WITH DIFFERENTIAL/PLATELET
Basophils Absolute: 0 10*3/uL (ref 0.0–0.1)
Basophils Relative: 0.5 % (ref 0.0–3.0)
Eosinophils Absolute: 0.4 10*3/uL (ref 0.0–0.7)
Eosinophils Relative: 6.3 % — ABNORMAL HIGH (ref 0.0–5.0)
HCT: 49.8 % (ref 39.0–52.0)
Hemoglobin: 16.8 g/dL (ref 13.0–17.0)
Lymphocytes Relative: 26.6 % (ref 12.0–46.0)
Lymphs Abs: 1.7 10*3/uL (ref 0.7–4.0)
MCHC: 33.8 g/dL (ref 30.0–36.0)
MCV: 91.4 fl (ref 78.0–100.0)
Monocytes Absolute: 0.6 10*3/uL (ref 0.1–1.0)
Monocytes Relative: 9.2 % (ref 3.0–12.0)
Neutro Abs: 3.6 10*3/uL (ref 1.4–7.7)
Neutrophils Relative %: 57.4 % (ref 43.0–77.0)
Platelets: 195 10*3/uL (ref 150.0–400.0)
RBC: 5.45 Mil/uL (ref 4.22–5.81)
RDW: 12.4 % (ref 11.5–15.5)
WBC: 6.3 10*3/uL (ref 4.0–10.5)

## 2021-07-20 LAB — LDL CHOLESTEROL, DIRECT: Direct LDL: 120 mg/dL

## 2021-07-20 LAB — LIPID PANEL
Cholesterol: 215 mg/dL — ABNORMAL HIGH (ref 0–200)
HDL: 45.4 mg/dL (ref >39.00)
NonHDL: 169.93
Total CHOL/HDL Ratio: 5
Triglycerides: 236 mg/dL — ABNORMAL HIGH (ref 0.0–149.0)
VLDL: 47.2 mg/dL — ABNORMAL HIGH (ref 0.0–40.0)

## 2021-07-20 LAB — COMPREHENSIVE METABOLIC PANEL
ALT: 28 U/L (ref 0–53)
AST: 24 U/L (ref 0–37)
Albumin: 4.5 g/dL (ref 3.5–5.2)
Alkaline Phosphatase: 57 U/L (ref 39–117)
BUN: 12 mg/dL (ref 6–23)
CO2: 31 mEq/L (ref 19–32)
Calcium: 9.7 mg/dL (ref 8.4–10.5)
Chloride: 100 mEq/L (ref 96–112)
Creatinine, Ser: 0.8 mg/dL (ref 0.40–1.50)
GFR: 98.63 mL/min (ref >60.00)
Glucose, Bld: 85 mg/dL (ref 70–99)
Potassium: 4.3 mEq/L (ref 3.5–5.1)
Sodium: 137 mEq/L (ref 135–145)
Total Bilirubin: 0.6 mg/dL (ref 0.2–1.2)
Total Protein: 7.3 g/dL (ref 6.0–8.3)

## 2021-07-20 LAB — VITAMIN D 25 HYDROXY (VIT D DEFICIENCY, FRACTURES): VITD: 23.28 ng/mL — ABNORMAL LOW (ref 30.00–100.00)

## 2021-07-20 LAB — PSA: PSA: 1.09 ng/mL (ref 0.10–4.00)

## 2021-07-20 MED ORDER — VALACYCLOVIR HCL 500 MG PO TABS: 500.0000 mg | ORAL_TABLET | Freq: Two times a day (BID) | ORAL | 3 refills | Status: DC | PRN

## 2021-07-20 MED ORDER — AMLODIPINE BESYLATE 10 MG PO TABS: 10.0000 mg | ORAL_TABLET | Freq: Every day | ORAL | 3 refills | Status: DC

## 2021-07-20 NOTE — Patient Instructions (Addendum)
Check the  blood pressure regularly BP GOAL is between 110/65 and  135/85. If it is consistently higher or lower, let me know  Start taking vitamin D3: 2000 units daily.   GO TO THE LAB : Get the blood work     [place], [place] back for   a physical exam  in 1 year   "Living will", "[place] of attorney": Advanced care planning  (If you already have a living will or healthcare power of attorney, please bring the copy to be scanned in your chart.)  Advance care planning is a process that supports adults in  understanding and sharing their preferences regarding future medical care.   The patient's preferences are recorded in documents called Advance Directives.    Advanced directives are completed (and can be modified at any time) while the patient is in full mental capacity.   The documentation should be available at all times to the patient, the family and the healthcare providers.  Bring in a copy to be scanned in your chart is an excellent idea and is recommended   This legal documents direct treatment decision making and/or appoint a surrogate to make the decision if the patient is not capable to do so.    Advance directives can be documented in many types of formats,  documents have names such as:  Lliving will  Durable power of attorney for healthcare (healthcare proxy or healthcare power of attorney)  Combined directives  Physician orders for life-sustaining treatment    More information at:  [email or web address]

## 2021-07-20 NOTE — Progress Notes (Signed)
Subjective:    Patient ID: Larry Burgess, male    DOB: 1965/11/01, 56 y.o.   MRN: [id number]  DOS:  07/20/2021 Type of visit - description: cpx  At the last visit, was diagnosed with urolithiasis.  Currently doing well with no symptoms after treatment.  3 weeks ago developed a cough and a runny nose. Still has a persisting cough, mostly dry. Taking NyQuil. Denies fever chills No chest pain or difficulty breathing  Review of Systems  Other than above, a 14 point review of systems is negative      Past Medical History:  Diagnosis Date   Herpes    Hypertension    on meds    Past Surgical History:  Procedure Laterality Date   COLONOSCOPY  2017   JMP-MAC-suprep(good)-TA   EXTRACORPOREAL SHOCK WAVE LITHOTRIPSY Right 01/25/2021   Procedure: EXTRACORPOREAL SHOCK WAVE LITHOTRIPSY (ESWL);  Surgeon: Ardis Hughs, MD;  Location: [facility];  Service: Urology;  Laterality: Right;   LAPAROSCOPIC INGUINAL HERNIA REPAIR Bilateral 1995   with mesh   WISDOM TOOTH EXTRACTION     Social History   Socioeconomic History   Marital status: Married    Spouse name: Not on file   Number of children: 0   Years of education: Not on file   Highest education level: Not on file  Occupational History   Occupation: Engineer, 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  Tobacco Use   Smoking status: Never   Smokeless tobacco: Never  Vaping Use   Vaping Use: Never used  Substance and Sexual Activity   Alcohol use: Yes    Alcohol/week: 2.0 standard drinks    Types: 2 Standard drinks or equivalent per week    Comment: occasionally   Drug use: No   Sexual activity: Yes  Other Topics Concern   Not on file  Social History Narrative   Original from [place], moved to [place] at age 49   Household- pt and wife   Social Determinants of Radio broadcast assistant Strain: Not on file  Food Insecurity: Not on file  Transportation Needs: Not on file  Physical Activity: Not on file  Stress: Not on file   Social Connections: Not on file  Intimate Partner Violence: Not on file    Current Outpatient Medications  Medication Instructions   amLODipine (NORVASC) 10 mg, Oral, Daily   triamcinolone cream (KENALOG) 0.1 % Topical, 3 times daily   valACYclovir (VALTREX) 500 mg, Oral, 2 times daily PRN   Vitamin D3 2,000 Units, Oral, Daily       Objective:   Physical Exam BP 126/82 (BP Location: Left Arm, Patient Position: Sitting, Cuff Size: Small)    Pulse 84    Temp 97.9 F (36.6 C) (Oral)    Resp 16    Ht 6' (1.829 m)    Wt 185 lb 2 oz (84 kg)    SpO2 97%    BMI 25.11 kg/m  General: Well developed, NAD, BMI noted Neck: No  thyromegaly  HEENT:  Normocephalic . Face symmetric, atraumatic Lungs:  CTA B Normal respiratory effort, no intercostal retractions, no accessory muscle use. Heart: RRR,  no murmur.  Abdomen:  Not distended, soft, non-tender. No rebound or rigidity.   Lower extremities: no pretibial edema bilaterally  Skin: Exposed areas without rash. Not pale. Not jaundice Neurologic:  alert & oriented X3.  Speech normal, gait appropriate for age and unassisted Strength symmetric and appropriate for age.  Psych: Cognition and judgment appear intact.  Cooperative with normal attention span and concentration.  Behavior appropriate. No anxious or depressed appearing.     Assessment     Assessment HTN HSV Urolithiasis Dx 0-2111. Abdominal aortic sclerosis per CT abd 8/022   PLAN: Here for CPX HTN: BP is very good, continue amlodipine. HSV: On daily Valtrex, RF sent. Cough: Started 3 weeks ago, gradually better, few rhonchi with cough, recommend cont nyquil,  if not back to normal in 10 days let me know.    Abdominal aortic sclerosis: per CT abd, plan>>  control CV RF, current 10-year risk 7.3% Vitamin D deficiency: Had ergocalciferol, now multivitamins.  Recheck levels, recommend vitamin D3 : 2000 units daily  RTC 1 year    This visit occurred during the  SARS-CoV-2 public health emergency.  Safety protocols were in place, including screening questions prior to the visit, additional usage of staff PPE, and extensive cleaning of exam room while observing appropriate contact time as indicated for disinfecting solutions.

## 2021-07-20 NOTE — Assessment & Plan Note (Signed)
-  Td 06/2018 - s/p  shingrix x 2  -COVID vaccine : UTD -flu shot, has declined consistently - CCS: Colon cancer screening: Cscope 08-2015, cscope 09-2020, next per GI -prostate ca screening: DRE- PSA 2021 wnl.  Recheck PSA -Diet-exercise discussed -Labs : CMP, FLP, CBC, PSA, vitamin D - ACP information provided

## 2021-07-20 NOTE — Assessment & Plan Note (Signed)
Here for CPX HTN: BP is very good, continue amlodipine. HSV: On daily Valtrex, RF sent. Cough: Started 3 weeks ago, gradually better, few rhonchi with cough, recommend cont nyquil,  if not back to normal in 10 days let me know.    Abdominal aortic sclerosis: per CT abd, plan>>  control CV RF, current 10-year risk 7.3% Vitamin D deficiency: Had ergocalciferol, now multivitamins.  Recheck levels, recommend vitamin D3 : 2000 units daily  RTC 1 year

## 2021-07-21 MED ORDER — VITAMIN D (ERGOCALCIFEROL) 1.25 MG (50000 UNIT) PO CAPS: 50000.0000 [IU] | ORAL_CAPSULE | ORAL | 0 refills | Status: DC

## 2021-07-21 NOTE — Addendum Note (Signed)
Addended byDamita Dunnings D on: 07/21/2021 03:32 PM   Modules accepted: Orders

## 2022-07-15 ENCOUNTER — Other Ambulatory Visit: Payer: Self-pay | Admitting: Internal Medicine

## 2022-07-26 ENCOUNTER — Ambulatory Visit (INDEPENDENT_AMBULATORY_CARE_PROVIDER_SITE_OTHER): Payer: BC Managed Care – PPO | Admitting: Internal Medicine

## 2022-07-26 ENCOUNTER — Encounter: Payer: Self-pay | Admitting: Internal Medicine

## 2022-07-26 VITALS — BP 126/82 | HR 83 | Temp 97.8°F | Resp 16 | Ht 72.0 in | Wt 187.4 lb

## 2022-07-26 DIAGNOSIS — E559 Vitamin D deficiency, unspecified: Secondary | ICD-10-CM | POA: Diagnosis not present

## 2022-07-26 DIAGNOSIS — Z0000 Encounter for general adult medical examination without abnormal findings: Secondary | ICD-10-CM | POA: Diagnosis not present

## 2022-07-26 DIAGNOSIS — I10 Essential (primary) hypertension: Secondary | ICD-10-CM | POA: Diagnosis not present

## 2022-07-26 LAB — COMPREHENSIVE METABOLIC PANEL
ALT: 28 U/L (ref 0–53)
AST: 21 U/L (ref 0–37)
Albumin: 4.2 g/dL (ref 3.5–5.2)
Alkaline Phosphatase: 51 U/L (ref 39–117)
BUN: 12 mg/dL (ref 6–23)
CO2: 32 mEq/L (ref 19–32)
Calcium: 9.5 mg/dL (ref 8.4–10.5)
Chloride: 99 mEq/L (ref 96–112)
Creatinine, Ser: 0.85 mg/dL (ref 0.40–1.50)
GFR: 96.15 mL/min (ref >60.00)
Glucose, Bld: 86 mg/dL (ref 70–99)
Potassium: 4.4 mEq/L (ref 3.5–5.1)
Sodium: 139 mEq/L (ref 135–145)
Total Bilirubin: 0.7 mg/dL (ref 0.2–1.2)
Total Protein: 6.7 g/dL (ref 6.0–8.3)

## 2022-07-26 LAB — LIPID PANEL
Cholesterol: 212 mg/dL — ABNORMAL HIGH (ref 0–200)
HDL: 44.9 mg/dL (ref >39.00)
NonHDL: 167.07
Total CHOL/HDL Ratio: 5
Triglycerides: 230 mg/dL — ABNORMAL HIGH (ref 0.0–149.0)
VLDL: 46 mg/dL — ABNORMAL HIGH (ref 0.0–40.0)

## 2022-07-26 LAB — CBC WITH DIFFERENTIAL/PLATELET
Basophils Absolute: 0 10*3/uL (ref 0.0–0.1)
Basophils Relative: 0.7 % (ref 0.0–3.0)
Eosinophils Absolute: 0.4 10*3/uL (ref 0.0–0.7)
Eosinophils Relative: 5.6 % — ABNORMAL HIGH (ref 0.0–5.0)
HCT: 49.9 % (ref 39.0–52.0)
Hemoglobin: 17.1 g/dL — ABNORMAL HIGH (ref 13.0–17.0)
Lymphocytes Relative: 27.5 % (ref 12.0–46.0)
Lymphs Abs: 1.8 10*3/uL (ref 0.7–4.0)
MCHC: 34.3 g/dL (ref 30.0–36.0)
MCV: 91.5 fl (ref 78.0–100.0)
Monocytes Absolute: 0.6 10*3/uL (ref 0.1–1.0)
Monocytes Relative: 9.7 % (ref 3.0–12.0)
Neutro Abs: 3.8 10*3/uL (ref 1.4–7.7)
Neutrophils Relative %: 56.5 % (ref 43.0–77.0)
Platelets: 212 10*3/uL (ref 150.0–400.0)
RBC: 5.45 Mil/uL (ref 4.22–5.81)
RDW: 12.5 % (ref 11.5–15.5)
WBC: 6.7 10*3/uL (ref 4.0–10.5)

## 2022-07-26 LAB — LDL CHOLESTEROL, DIRECT: Direct LDL: 126 mg/dL

## 2022-07-26 LAB — PSA: PSA: 1.39 ng/mL (ref 0.10–4.00)

## 2022-07-26 LAB — VITAMIN D 25 HYDROXY (VIT D DEFICIENCY, FRACTURES): VITD: 21.27 ng/mL — ABNORMAL LOW (ref 30.00–100.00)

## 2022-07-26 MED ORDER — SCOPOLAMINE 1 MG/3DAYS TD PT72: 1.0000 | MEDICATED_PATCH | TRANSDERMAL | 0 refills | Status: DC

## 2022-07-26 NOTE — Progress Notes (Unsigned)
Subjective:    Patient ID: Larry Burgess, male    DOB: 1965/11/24, 57 y.o.   MRN: [id number]  DOS:  07/26/2022 Type of visit - description: CPX  Since the last visit is doing well. Occasionally has itchy feeling at the right flank without nausea vomiting dysuria or gross hematuria.  Review of Systems See above   Past Medical History:  Diagnosis Date   Herpes    Hypertension    on meds    Past Surgical History:  Procedure Laterality Date   COLONOSCOPY  2017   JMP-MAC-suprep(good)-TA   EXTRACORPOREAL SHOCK WAVE LITHOTRIPSY Right 01/25/2021   Procedure: EXTRACORPOREAL SHOCK WAVE LITHOTRIPSY (ESWL);  Surgeon: Ardis Hughs, MD;  Location: [facility];  Service: Urology;  Laterality: Right;   LAPAROSCOPIC INGUINAL HERNIA REPAIR Bilateral 1995   with mesh   WISDOM TOOTH EXTRACTION      Current Outpatient Medications  Medication Instructions   amLODipine (NORVASC) 10 mg, Oral, Daily   valACYclovir (VALTREX) 500 mg, Oral, 2 times daily PRN   Vitamin D (Ergocalciferol) (DRISDOL) 50,000 Units, Oral, Every 7 days   Vitamin D3 2,000 Units, Oral, Daily       Objective:   Physical Exam BP 126/82   Pulse 83   Temp 97.8 F (36.6 C) (Oral)   Resp 16   Ht 6' (1.829 m)   Wt 187 lb 6 oz (85 kg)   SpO2 97%   BMI 25.41 kg/m  General: Well developed, NAD, BMI noted Neck: No  thyromegaly  HEENT:  Normocephalic . Face symmetric, atraumatic Lungs:  CTA B Normal respiratory effort, no intercostal retractions, no accessory muscle use. Heart: RRR,  no murmur.  Abdomen:  Not distended, soft, non-tender. No rebound or rigidity.   Lower extremities: no pretibial edema bilaterally  Skin: Exposed areas without rash. Not pale. Not jaundice Neurologic:  alert & oriented X3.  Speech normal, gait appropriate for age and unassisted Strength symmetric and appropriate for age.  Psych: Cognition and judgment appear intact.  Cooperative with normal attention span and  concentration.  Behavior appropriate. No anxious or depressed appearing.     Assessment     Assessment HTN HSV Urolithiasis Dx [id number]. Abdominal aortic sclerosis per CT abd 8/022   PLAN: Here for CPX HTN: On amlodipine, no change, recommend to check BPs.  Checking labs HSV: On Valtrex as needed Urolithiasis: Occasionally has "itchy feeling" at the right flank without other symptoms.  Recommend to seek attention if he has severe pain, gross hematuria, nausea or vomiting. Vitamin D deficiency, on OTCs, check levels. Seasickness, request a treatment, scopolamine patch sent, recommend to try the head of the trip to be sure he tolerates it. - Td 06/2018 - s/p  shingrix x 2  -COVID vaccine : UTD ? Rec to have the vax released 02-2022  -flu shot: declines  - CCS: Colon cancer screening: Cscope 08-2015, cscope 09-2020, next 7 years per GI -prostate ca screening: No symptoms, check PSA -Diet is healthy. Very active at work -Labs : CMP, FLP, CBC, PSA      The 10-year ASCVD risk score (Arnett DK, et al., 2019) is: 8.8%   Values used to calculate the score:     Age: 20 years     Sex: Male     Is Non-Hispanic African American: No     Diabetic: No     Tobacco smoker: No     Systolic Blood Pressure: 123XX123 mmHg     Is  BP treated: Yes     HDL Cholesterol: 45.4 mg/dL     Total Cholesterol: 215 mg/dL  ==== HTN: BP is very good, continue amlodipine. HSV: On daily Valtrex, RF sent. Cough: Started 3 weeks ago, gradually better, few rhonchi with cough, recommend cont nyquil,  if not back to normal in 10 days let me know.    Abdominal aortic sclerosis: per CT abd, plan>>  control CV RF, current 10-year risk 7.3% Vitamin D deficiency: Had ergocalciferol, now multivitamins.  Recheck levels, recommend vitamin D3 : 2000 units daily  RTC 1 year

## 2022-07-26 NOTE — Patient Instructions (Addendum)
Vaccines I recommend:  If you have not gotten the COVID-vaccine released  September 2023,  I recommend you to get it.  Check the  blood pressure regularly BP GOAL is between 110/65 and  135/85. If it is consistently higher or lower, let me know     GO TO THE LAB : Get the blood work     Larry Burgess, Larry Burgess back for a physical exam in 1 year

## 2022-07-27 ENCOUNTER — Encounter: Payer: Self-pay | Admitting: Internal Medicine

## 2022-07-27 NOTE — Assessment & Plan Note (Signed)
Here for CPX HTN: On amlodipine, no change, recommend to check BPs.  Checking labs HSV: On Valtrex as needed Urolithiasis: Occasionally has "itchy feeling" at the right flank without other symptoms.  Recommend to seek attention if he has severe pain, gross hematuria, nausea or vomiting. Vitamin D deficiency, on OTCs, check levels. Seasickness, request a treatment, scopolamine patch sent, recommend to try the head of the trip to be sure he tolerates it. RTC 1 year

## 2022-07-27 NOTE — Assessment & Plan Note (Signed)
-   Td 06/2018 - s/p  shingrix x 2  -COVID vaccine : UTD ? Rec to have the vax released 02-2022  -flu shot: declines  - CCS: Colon cancer screening: Cscope 08-2015, cscope 09-2020, next 7 years per GI -prostate ca screening: No symptoms, check PSA -Diet is healthy. Very active at work -Labs : CMP, FLP, CBC, PSA

## 2022-07-28 MED ORDER — VITAMIN D (ERGOCALCIFEROL) 1.25 MG (50000 UNIT) PO CAPS: 50000.0000 [IU] | ORAL_CAPSULE | ORAL | 0 refills | Status: DC

## 2022-07-28 NOTE — Addendum Note (Signed)
Addended byDamita Dunnings D on: 07/28/2022 07:52 AM   Modules accepted: Orders

## 2022-10-14 ENCOUNTER — Other Ambulatory Visit: Payer: Self-pay | Admitting: Internal Medicine

## 2023-01-08 IMAGING — CT CT ABD-PELV W/O CM
2 of 4 series · 16 of 46 positions shown, 18 images · non-contrast
Comparison: No priors.

CLINICAL DATA: 55-year-old male with history of right-sided
abdominal and flank pain for the past 2-3 weeks.

EXAM:
CT ABDOMEN AND PELVIS WITHOUT CONTRAST
TECHNIQUE: Multidetector CT imaging of the abdomen and pelvis was performed
following the standard protocol without IV contrast.

[Series 2: axial st · axial · 0.86mm/px · z∈[-498,-72]mm · 13 of 93 slices shown, 15 images]
[im 4/93  soft-tissue]
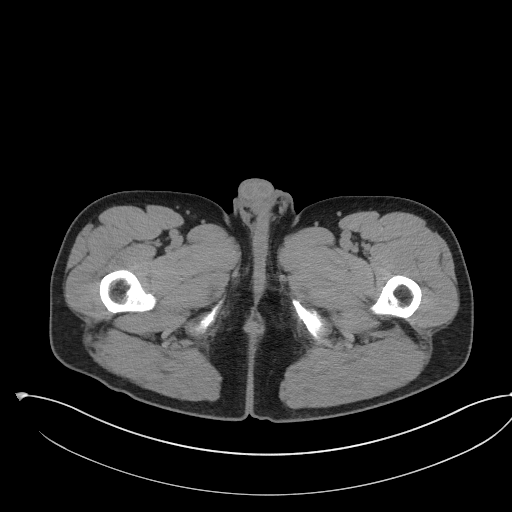
[im 4/93  bone]
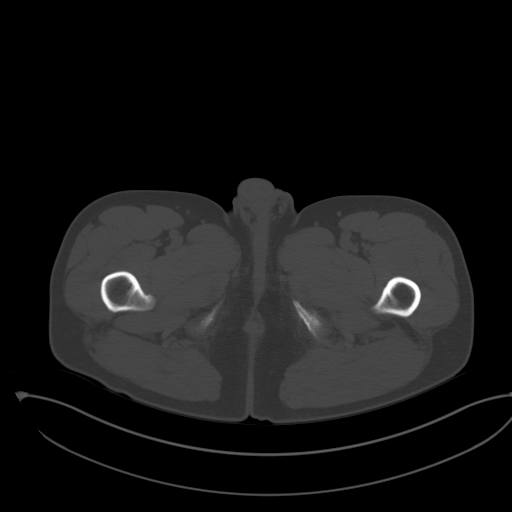
[im 11/93  soft-tissue]
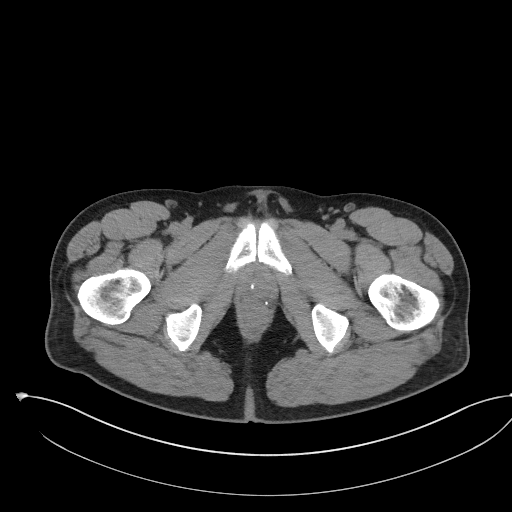
[im 18/93  soft-tissue]
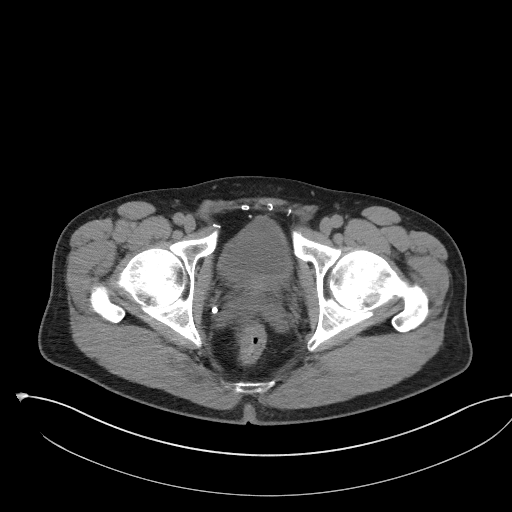
[im 25/93  soft-tissue]
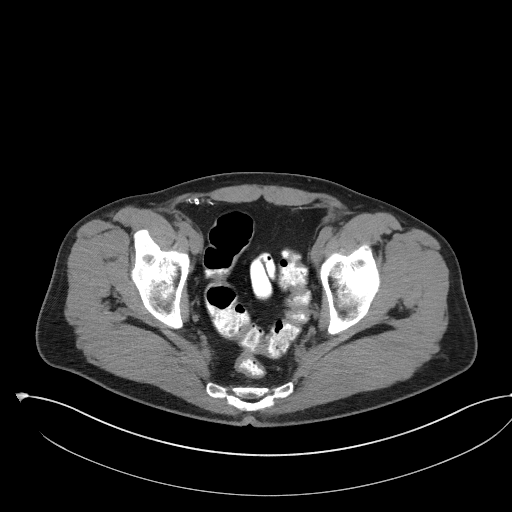
[im 32/93  soft-tissue]
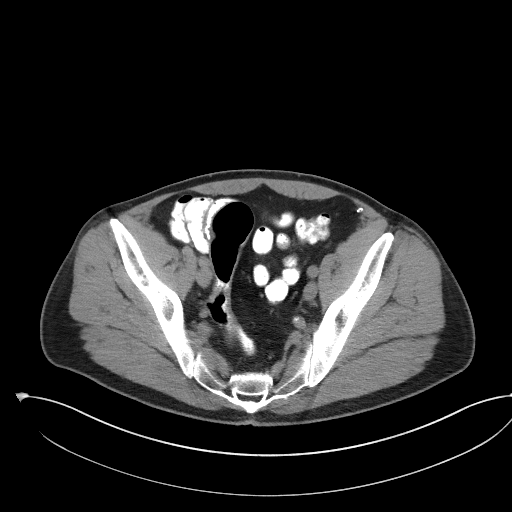
[im 39/93  soft-tissue]
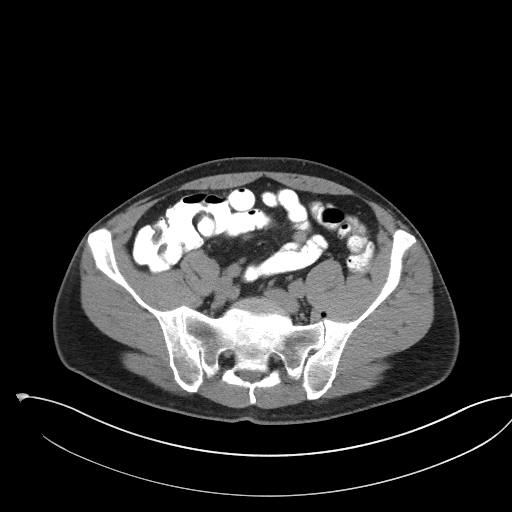
[im 47/93  soft-tissue]
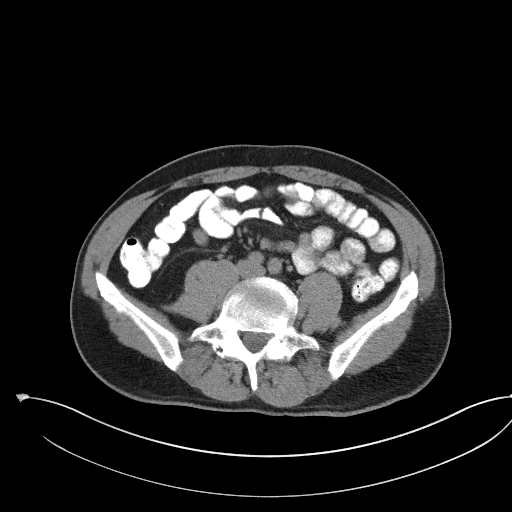
[im 54/93  soft-tissue]
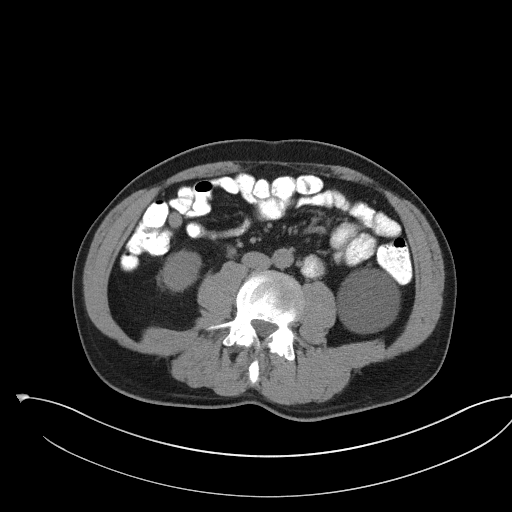
[im 61/93  soft-tissue]
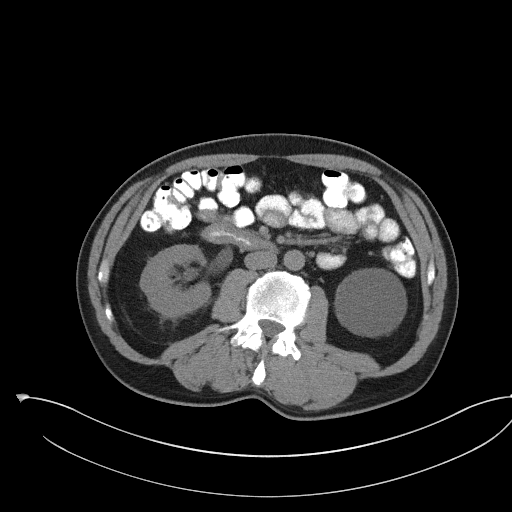
[im 61/93  bone]
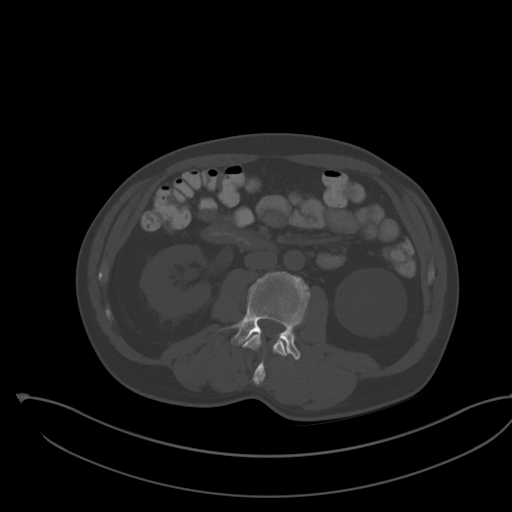
[im 68/93  soft-tissue]
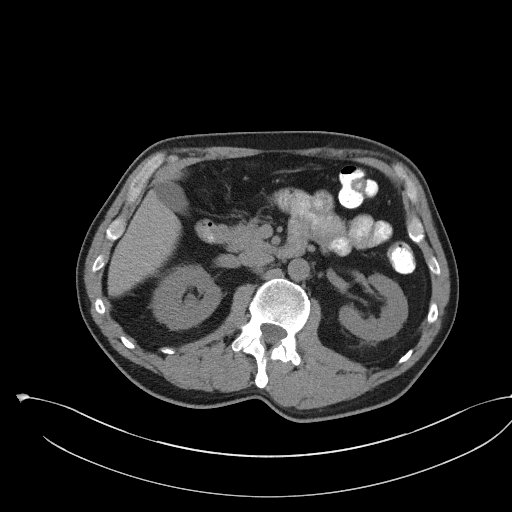
[im 75/93  soft-tissue]
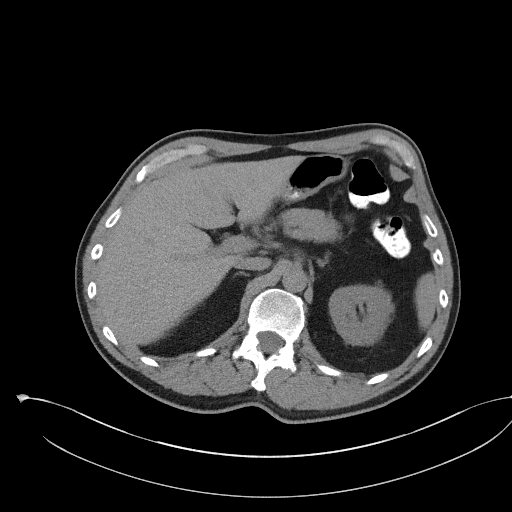
[im 82/93  soft-tissue]
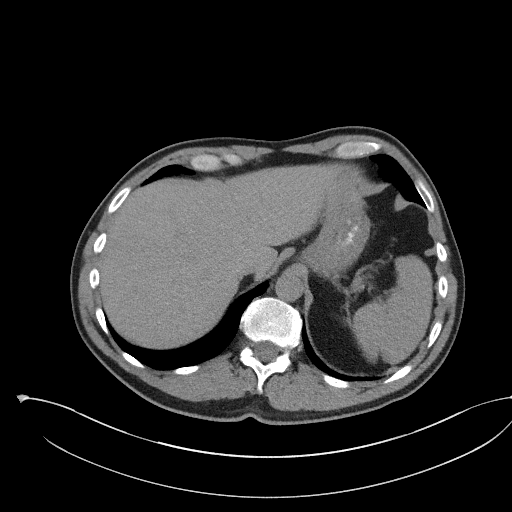
[im 89/93  soft-tissue]
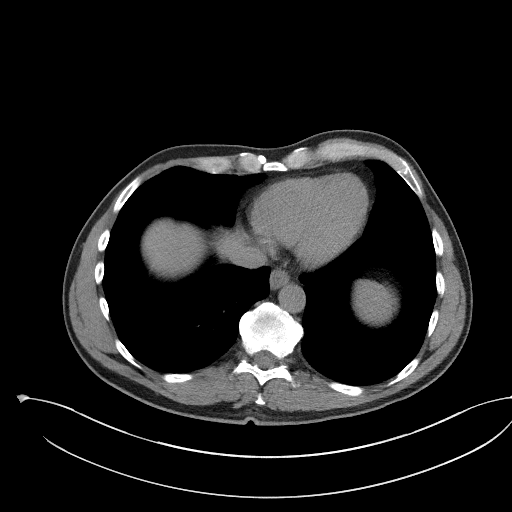

[Series 5: coronal st · coronal · 0.80mm/px · 3 of 85 slices shown]
[im 29/85  soft-tissue]
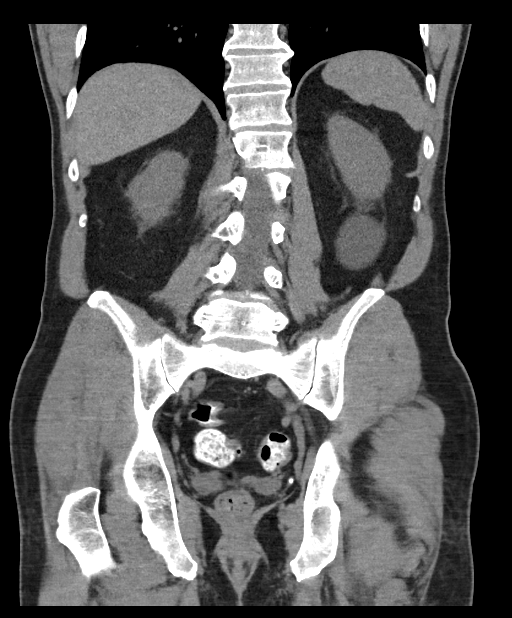
[im 38/85  soft-tissue]
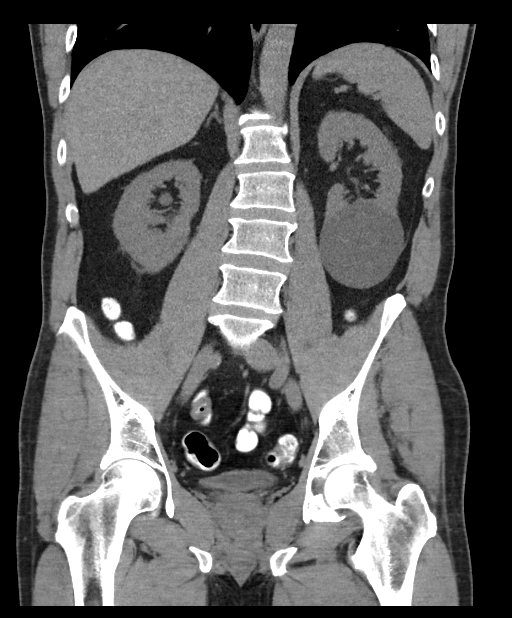
[im 47/85  soft-tissue]
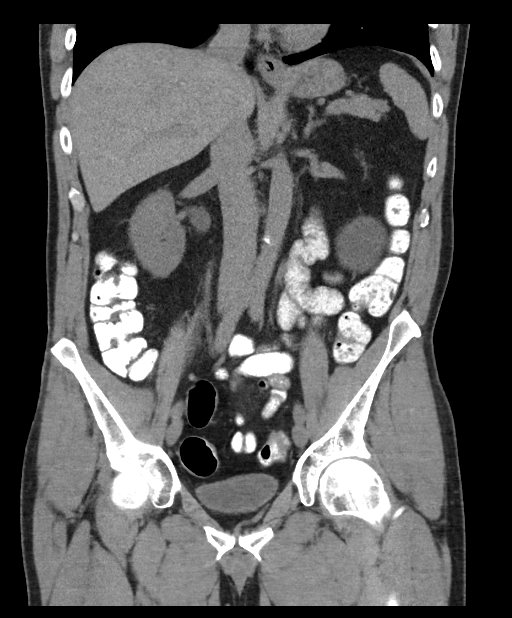

[16 of 46 positions shown; findings below may reference images not displayed]

FINDINGS: Lower chest: Unremarkable.

Hepatobiliary: No suspicious cystic or solid hepatic lesions are
confidently identified on today's noncontrast CT examination.
Gallbladder is nearly completely decompressed, but otherwise
unremarkable in appearance.

Pancreas: No definite pancreatic mass or peripancreatic fluid
collections or inflammatory changes are noted on today's noncontrast
CT examination.

Spleen: Unremarkable.

Adrenals/Urinary Tract: 6 mm calculus in the proximal third of the
right ureter shortly beyond the right ureteropelvic junction with
mild proximal right hydroureteronephrosis. No additional calculi are
identified in the collecting system of the left kidney, elsewhere
along the course of either ureter, or within the lumen of the
urinary bladder. No left hydroureteronephrosis. Exophytic 6.7 cm
low-attenuation lesion in the lower pole of the left kidney,
incompletely characterized on today's non-contrast CT examination,
but statistically likely to represent a cyst. Bilateral adrenal
glands are normal in appearance. Urinary bladder is normal in
appearance.

Stomach/Bowel: The appearance of the stomach is normal. There is no
pathologic dilatation of small bowel or colon. Normal appendix.

Vascular/Lymphatic: Aortic atherosclerosis. No lymphadenopathy noted
in the abdomen or pelvis.

Reproductive: Prostate gland and seminal vesicles are unremarkable
in appearance.

Other: Status post bilateral inguinal herniorrhaphy. No significant
volume of ascites. No pneumoperitoneum.

Musculoskeletal: There are no aggressive appearing lytic or blastic
lesions noted in the visualized portions of the skeleton.
IMPRESSION: 1. 6 mm obstructive calculus in the proximal third of the right
ureter shortly beyond the right ureteropelvic junction with mild
proximal right hydroureteronephrosis.
2. Aortic atherosclerosis.

## 2023-01-11 ENCOUNTER — Other Ambulatory Visit: Payer: Self-pay | Admitting: Internal Medicine

## 2023-01-17 IMAGING — DX DG ABDOMEN 1V
2 series · 2 of 2 positions shown · non-contrast
Comparison: 01/21/2021

CLINICAL DATA: Right urolithiasis

EXAM:
ABDOMEN - 1 VIEW

[abdomen kub (1 of 2)]
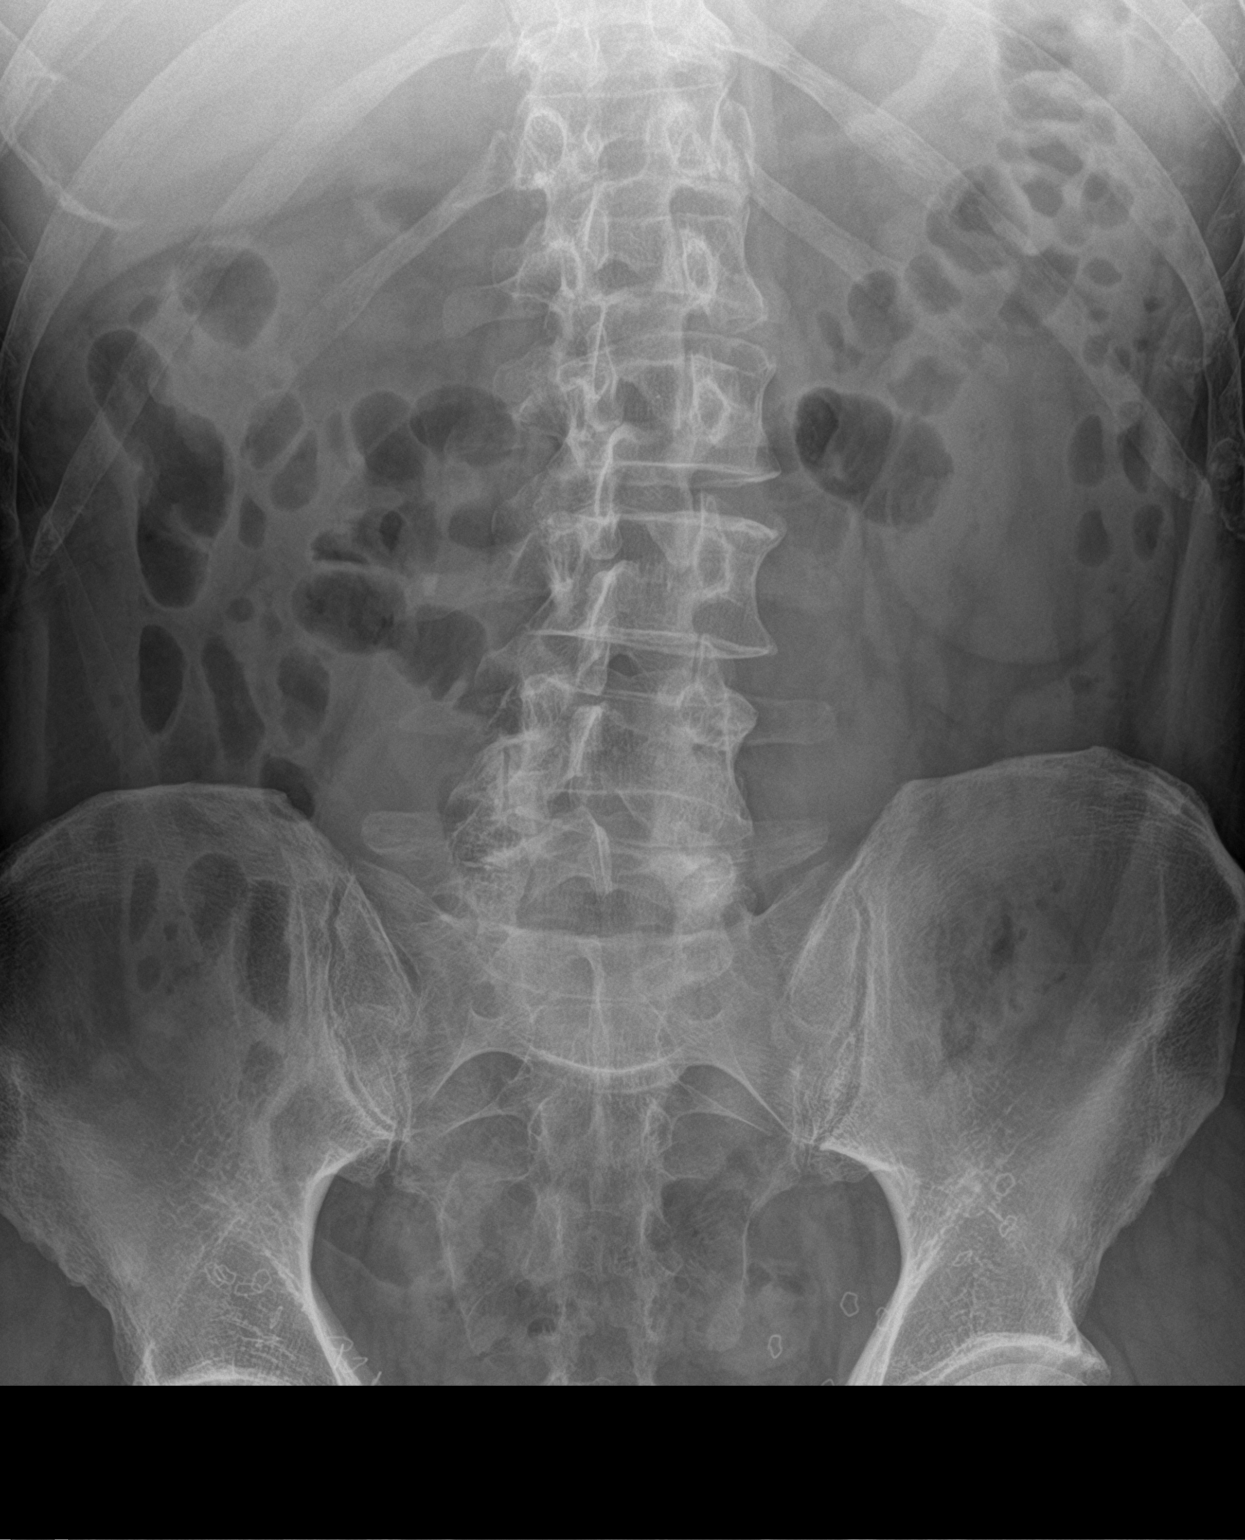

[abdomen kub (2 of 2)]
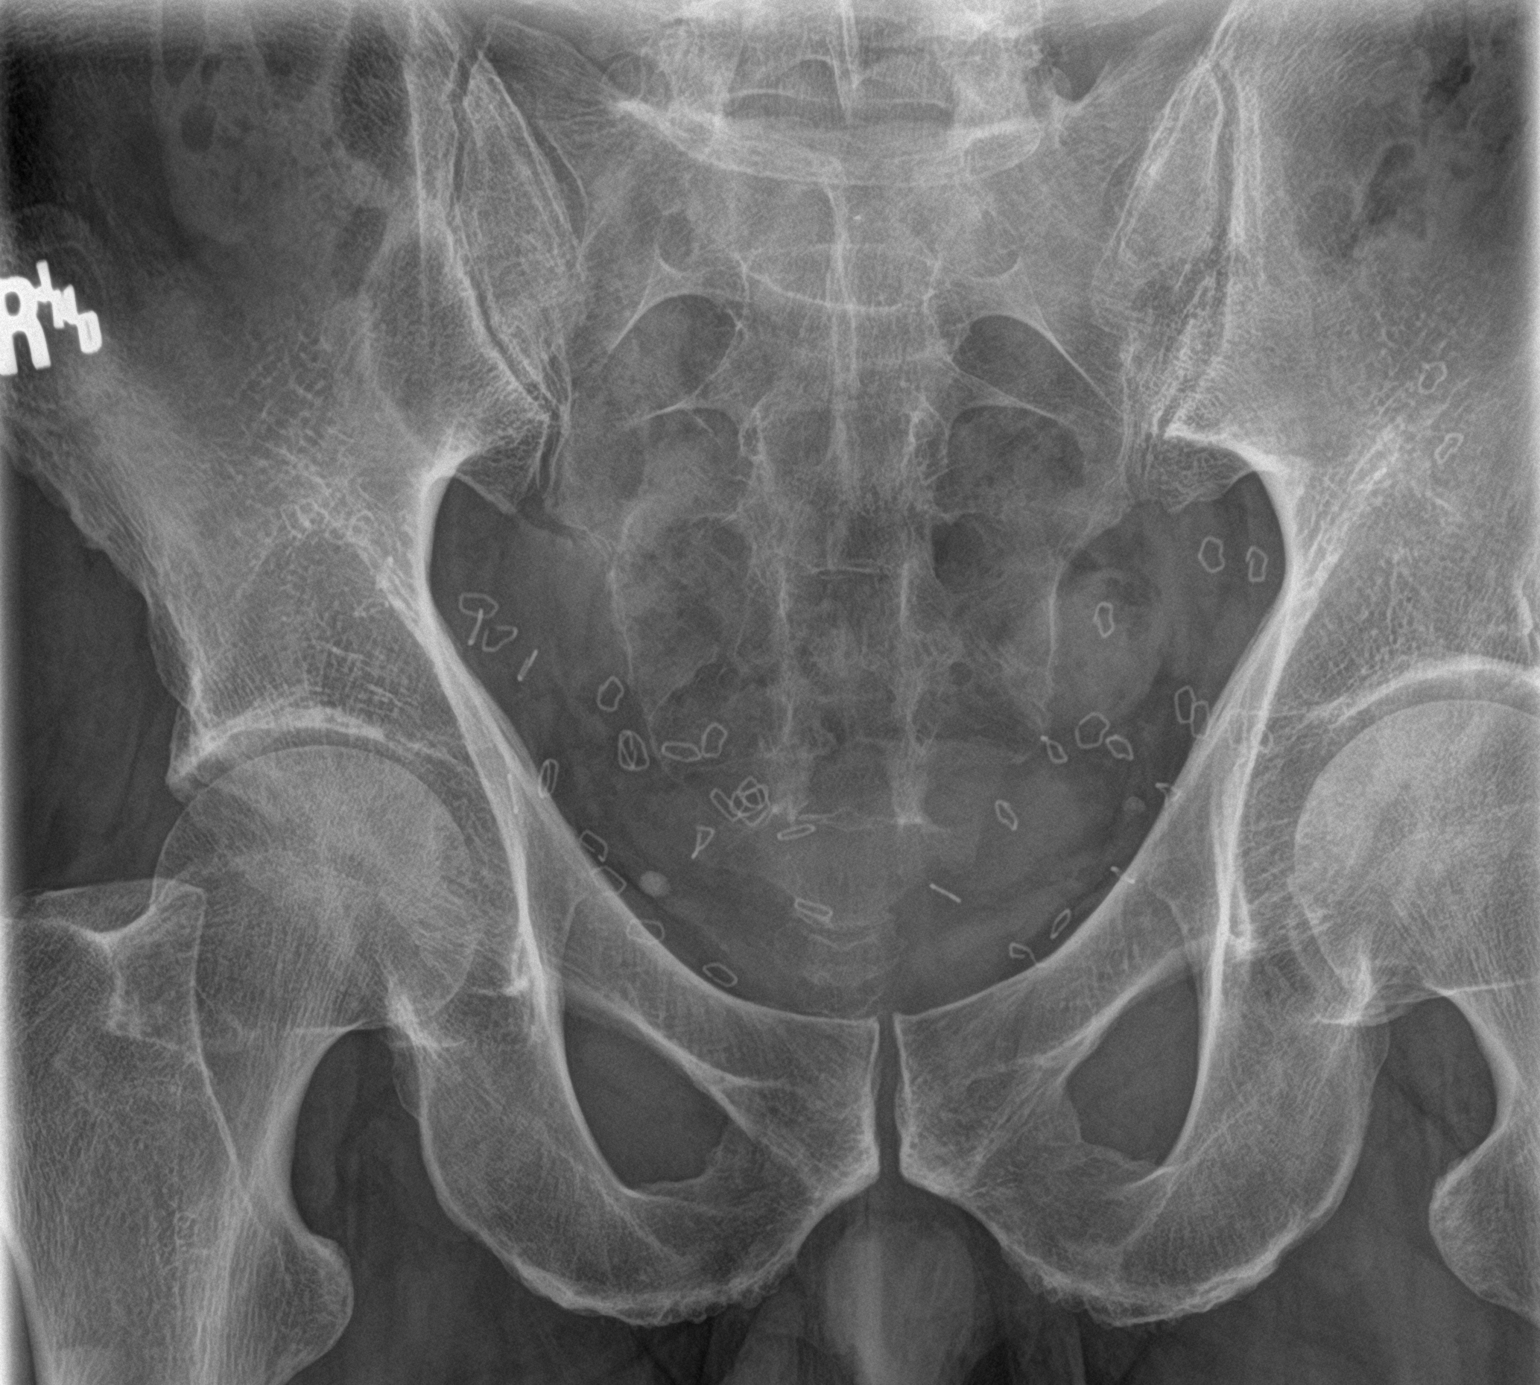

[2 of 2 positions shown; findings below may reference images not displayed]

FINDINGS: Stable 4 mm calculus in the mid right ureter at the level of the L3
transverse process.

Normal bowel gas pattern. Mild lumbar levoscoliosis with facet DJD
L4-5. Bilateral pelvic phleboliths and hernia repair clips.
IMPRESSION: Stable 4 mm mid right ureteral calculus.

## 2023-02-27 ENCOUNTER — Telehealth: Payer: Self-pay | Admitting: Internal Medicine

## 2023-02-27 MED ORDER — AMLODIPINE BESYLATE 10 MG PO TABS: 10.0000 mg | ORAL_TABLET | Freq: Every day | ORAL | 1 refills | Status: DC

## 2023-02-27 MED ORDER — VALACYCLOVIR HCL 500 MG PO TABS: 500.0000 mg | ORAL_TABLET | Freq: Two times a day (BID) | ORAL | 3 refills | Status: AC | PRN

## 2023-02-27 NOTE — Telephone Encounter (Signed)
Pt requesting his pharmacy to be changed to [facility] order pharmacy. Please send in two active prescriptions - Amlodipine and Valacyclovir- to [facility].

## 2023-02-27 NOTE — Telephone Encounter (Signed)
Rx sent 

## 2023-02-27 NOTE — Addendum Note (Signed)
Addended byConrad [place] D on: 02/27/2023 09:39 AM   Modules accepted: Orders

## 2023-07-23 ENCOUNTER — Other Ambulatory Visit: Payer: Self-pay | Admitting: Internal Medicine

## 2023-07-25 DIAGNOSIS — H35361 Drusen (degenerative) of macula, right eye: Secondary | ICD-10-CM | POA: Diagnosis not present

## 2023-07-25 DIAGNOSIS — H43812 Vitreous degeneration, left eye: Secondary | ICD-10-CM | POA: Diagnosis not present

## 2023-07-31 ENCOUNTER — Ambulatory Visit (INDEPENDENT_AMBULATORY_CARE_PROVIDER_SITE_OTHER): Payer: BC Managed Care – PPO | Admitting: Internal Medicine

## 2023-07-31 ENCOUNTER — Encounter: Payer: Self-pay | Admitting: Internal Medicine

## 2023-07-31 VITALS — BP 136/80 | HR 79 | Temp 97.8°F | Resp 16 | Ht 72.0 in | Wt 188.2 lb

## 2023-07-31 DIAGNOSIS — E785 Hyperlipidemia, unspecified: Secondary | ICD-10-CM

## 2023-07-31 DIAGNOSIS — E559 Vitamin D deficiency, unspecified: Secondary | ICD-10-CM

## 2023-07-31 DIAGNOSIS — R399 Unspecified symptoms and signs involving the genitourinary system: Secondary | ICD-10-CM | POA: Diagnosis not present

## 2023-07-31 DIAGNOSIS — Z0000 Encounter for general adult medical examination without abnormal findings: Secondary | ICD-10-CM

## 2023-07-31 DIAGNOSIS — I10 Essential (primary) hypertension: Secondary | ICD-10-CM | POA: Diagnosis not present

## 2023-07-31 LAB — CBC WITH DIFFERENTIAL/PLATELET
Basophils Absolute: 0 10*3/uL (ref 0.0–0.1)
Basophils Relative: 0.5 % (ref 0.0–3.0)
Eosinophils Absolute: 0.4 10*3/uL (ref 0.0–0.7)
Eosinophils Relative: 5.9 % — ABNORMAL HIGH (ref 0.0–5.0)
HCT: 45.3 % (ref 39.0–52.0)
Hemoglobin: 15.7 g/dL (ref 13.0–17.0)
Lymphocytes Relative: 25.5 % (ref 12.0–46.0)
Lymphs Abs: 1.7 10*3/uL (ref 0.7–4.0)
MCHC: 34.6 g/dL (ref 30.0–36.0)
MCV: 92.3 fL (ref 78.0–100.0)
Monocytes Absolute: 0.7 10*3/uL (ref 0.1–1.0)
Monocytes Relative: 10.1 % (ref 3.0–12.0)
Neutro Abs: 3.9 10*3/uL (ref 1.4–7.7)
Neutrophils Relative %: 58 % (ref 43.0–77.0)
Platelets: 191 10*3/uL (ref 150.0–400.0)
RBC: 4.91 Mil/uL (ref 4.22–5.81)
RDW: 12.5 % (ref 11.5–15.5)
WBC: 6.7 10*3/uL (ref 4.0–10.5)

## 2023-07-31 LAB — LIPID PANEL
Cholesterol: 179 mg/dL (ref 0–200)
HDL: 39.5 mg/dL (ref >39.00)
LDL Cholesterol: 98 mg/dL (ref 0–99)
NonHDL: 139.92
Total CHOL/HDL Ratio: 5
Triglycerides: 210 mg/dL — ABNORMAL HIGH (ref 0.0–149.0)
VLDL: 42 mg/dL — ABNORMAL HIGH (ref 0.0–40.0)

## 2023-07-31 LAB — URINALYSIS, ROUTINE W REFLEX MICROSCOPIC
Bilirubin Urine: NEGATIVE
Hgb urine dipstick: NEGATIVE
Ketones, ur: NEGATIVE
Leukocytes,Ua: NEGATIVE
Nitrite: NEGATIVE
RBC / HPF: NONE SEEN (ref 0–?)
Specific Gravity, Urine: >=1.03 — AB (ref 1.000–1.030)
Total Protein, Urine: NEGATIVE
Urine Glucose: NEGATIVE
Urobilinogen, UA: 0.2 (ref 0.0–1.0)
pH: 6 (ref 5.0–8.0)

## 2023-07-31 LAB — COMPREHENSIVE METABOLIC PANEL
ALT: 23 U/L (ref 0–53)
AST: 22 U/L (ref 0–37)
Albumin: 4.2 g/dL (ref 3.5–5.2)
Alkaline Phosphatase: 57 U/L (ref 39–117)
BUN: 12 mg/dL (ref 6–23)
CO2: 28 meq/L (ref 19–32)
Calcium: 8.8 mg/dL (ref 8.4–10.5)
Chloride: 102 meq/L (ref 96–112)
Creatinine, Ser: 0.8 mg/dL (ref 0.40–1.50)
GFR: 97.24 mL/min (ref >60.00)
Glucose, Bld: 87 mg/dL (ref 70–99)
Potassium: 4.5 meq/L (ref 3.5–5.1)
Sodium: 139 meq/L (ref 135–145)
Total Bilirubin: 0.6 mg/dL (ref 0.2–1.2)
Total Protein: 6.8 g/dL (ref 6.0–8.3)

## 2023-07-31 LAB — VITAMIN D 25 HYDROXY (VIT D DEFICIENCY, FRACTURES): VITD: 29.17 ng/mL — ABNORMAL LOW (ref 30.00–100.00)

## 2023-07-31 LAB — PSA: PSA: 1.13 ng/mL (ref 0.10–4.00)

## 2023-07-31 MED ORDER — PANTOPRAZOLE SODIUM 40 MG PO TBEC: 40.0000 mg | DELAYED_RELEASE_TABLET | Freq: Every day | ORAL | 1 refills | Status: DC

## 2023-07-31 NOTE — Patient Instructions (Signed)
 For cough: Take pantoprazole 40 mg: 1 tablet on empty stomach before dinner. After 1 month, take only 1 tablet every other day for the next 2 weeks. Then stop pantoprazole. If your cough is not better, let us know.   Check the  blood pressure regularly Blood pressure goal:  between 110/65 and  135/85. If it is consistently higher or lower, let me know     GO TO THE LAB : Get the blood work     Next visit with me in 1 year for another physical exam.     Please schedule it at the front desk       "Health Care Power of attorney" ,  "Living will" (Advance care planning documents)  If you already have a living will or healthcare power of attorney, is recommended you bring the copy to be scanned in your chart.   The document will be available to all the doctors you see in the system.  Advance care planning is a process that supports adults in  understanding and sharing their preferences regarding future medical care.  The patient's preferences are recorded in documents called Advance Directives and the can be modified at any time while the patient is in full mental capacity.   If you don't have one, please consider create one.      More information at: [email or web address]

## 2023-07-31 NOTE — Progress Notes (Unsigned)
 Subjective:    Patient ID: Larry Burgess, male    DOB: 1965-09-26, 58 y.o.   MRN: [id number]  DOS:  07/31/2023 Type of visit - description: CPX Here for CPX. He had a mild cold in November 2024, since then has mild cough in the mornings, typically dry. Denies postnasal dripping. No sneezing or watery eyes.  No wheezing.  No fever or chills. Admits to occasional heartburn. Also when asked, admits occasional LUTS, slow flow?.  No dysuria or gross hematuria.  Review of Systems  Other than above, a 14 point review of systems is negative    Past Medical History:  Diagnosis Date   Herpes    Hypertension    on meds    Past Surgical History:  Procedure Laterality Date   COLONOSCOPY  2017   JMP-MAC-suprep(good)-TA   EXTRACORPOREAL SHOCK WAVE LITHOTRIPSY Right 01/25/2021   Procedure: EXTRACORPOREAL SHOCK WAVE LITHOTRIPSY (ESWL);  Surgeon: Crist Fat, MD;  Location: [facility];  Service: Urology;  Laterality: Right;   LAPAROSCOPIC INGUINAL HERNIA REPAIR Bilateral 1995   with mesh   WISDOM TOOTH EXTRACTION     Social History   Socioeconomic History   Marital status: Married    Spouse name: Not on file   Number of children: 0   Years of education: Not on file   Highest education level: Not on file  Occupational History   Occupation: Solicitor - Charity fundraiser  Tobacco Use   Smoking status: Never   Smokeless tobacco: Never  Vaping Use   Vaping status: Never Used  Substance and Sexual Activity   Alcohol use: Yes    Alcohol/week: 2.0 standard drinks of alcohol    Types: 2 Standard drinks or equivalent per week    Comment: occasionally   Drug use: No   Sexual activity: Yes  Other Topics Concern   Not on file  Social History Narrative   Original from [place], moved to [place] at age 23   Household- pt and wife   Social Drivers of Corporate investment banker Strain: Not on Ship broker Insecurity: Not on file  Transportation Needs: Not on file  Physical  Activity: Not on file  Stress: Not on file  Social Connections: Not on file  Intimate Partner Violence: Not on file    Current Outpatient Medications  Medication Instructions   amLODipine (NORVASC) 10 mg, Oral, Daily   pantoprazole (PROTONIX) 40 mg, Oral, Daily before breakfast   valACYclovir (VALTREX) 500 mg, Oral, 2 times daily PRN   Vitamin D3 2,000 Units, Daily       Objective:   Physical Exam BP 136/80   Pulse 79   Temp 97.8 F (36.6 C) (Oral)   Resp 16   Ht 6' (1.829 m)   Wt 188 lb 4 oz (85.4 kg)   SpO2 98%   BMI 25.53 kg/m  General: Well developed, NAD, BMI noted Neck: No  thyromegaly  HEENT:  Normocephalic . Face symmetric, atraumatic Lungs:  CTA B Normal respiratory effort, no intercostal retractions, no accessory muscle use. Heart: RRR,  no murmur.  Abdomen:  Not distended, soft, non-tender. No rebound or rigidity.   Lower extremities: no pretibial edema bilaterally  Skin: Exposed areas without rash. Not pale. Not jaundice Neurologic:  alert & oriented X3.  Speech normal, gait appropriate for age and unassisted Strength symmetric and appropriate for age.  Psych: Cognition and judgment appear intact.  Cooperative with normal attention span and concentration.  Behavior appropriate.  No anxious or depressed appearing.     Assessment     Assessment HTN HSV Vitamin D deficiency Urolithiasis Dx 01-2021. Abdominal aortic sclerosis per CT abd 8/022   PLAN: Here for CPX. -Td 06/2018 - s/p  shingrix x 2  -COVID vaccine few months ago  -flu shot: declines  - CCS: Colon cancer screening: Cscope 08-2015, cscope 09-2020, next 7 years per GI -prostate ca screening: Minimal LUTS, occasional slow flow?.  Check a UA urine culture PSA -Diet: Typically healthy, encouraged to decrease carbohydrates.  He is active at work, recommend to do some additional exercise. -Labs : CMP FLP CBC PSA vitamin D UA urine culture We also address the following issues: HTN:  Ambulatory BPs check from time to time, they are okay, BP today 136/80.  Continue amlodipine.  Labs. Vitamin D deficiency: Currently on OTC vitamin D, taking it. Cough: As described above, postviral versus GERD.  Trial with PPIs for 6 weeks, see AVS.  Call if not better Cardiovascular risk calculator 10.8%, this was discussed with the patient.  If it is significantly higher consider medication. RTC 1 year

## 2023-08-01 ENCOUNTER — Encounter: Payer: Self-pay | Admitting: Internal Medicine

## 2023-08-01 LAB — URINE CULTURE
MICRO NUMBER:: 16092451
Result:: NO GROWTH
SPECIMEN QUALITY:: ADEQUATE

## 2023-08-01 NOTE — Assessment & Plan Note (Signed)
 Here for CPX.  We also address the following issues: HTN: Ambulatory BPs check from time to time, they are okay, BP today 136/80.  Continue amlodipine.  Labs. Vitamin D deficiency: Currently on OTC vitamin D, taking it. Cough: As described above, postviral versus GERD.  Trial with PPIs for 6 weeks, see AVS.  Call if not better Cardiovascular risk calculator 10.8%, this was discussed with the patient.  If it is significantly higher consider medication. RTC 1 year

## 2023-08-01 NOTE — Assessment & Plan Note (Signed)
 Here for CPX. -Td 06/2018 - s/p  shingrix x 2  -COVID vaccine few months ago  -flu shot: declines  - CCS: Colon cancer screening: Cscope 08-2015, cscope 09-2020, next 7 years per GI -prostate ca screening: Minimal LUTS, occasional slow flow?.  Check a UA urine culture PSA -Diet: Typically healthy, encouraged to decrease carbohydrates.  He is active at work, recommend to do some additional exercise. -Labs : CMP FLP CBC PSA vitamin D UA urine culture

## 2023-08-03 ENCOUNTER — Encounter: Payer: Self-pay | Admitting: Internal Medicine

## 2023-08-03 MED ORDER — ATORVASTATIN CALCIUM 20 MG PO TABS: 20.0000 mg | ORAL_TABLET | Freq: Every day | ORAL | 0 refills | Status: DC

## 2023-08-03 MED ORDER — VITAMIN D (ERGOCALCIFEROL) 1.25 MG (50000 UNIT) PO CAPS
50000.0000 [IU] | ORAL_CAPSULE | ORAL | 0 refills | Status: AC
Start: 2023-08-03 — End: 2023-10-26

## 2023-08-03 NOTE — Addendum Note (Signed)
 Addended byConrad [place] D on: 08/03/2023 01:46 PM   Modules accepted: Orders

## 2023-10-03 ENCOUNTER — Other Ambulatory Visit: Payer: Self-pay | Admitting: Internal Medicine

## 2023-10-09 ENCOUNTER — Other Ambulatory Visit: Payer: Self-pay | Admitting: Internal Medicine

## 2024-01-26 ENCOUNTER — Other Ambulatory Visit: Payer: Self-pay | Admitting: Internal Medicine

## 2024-02-12 ENCOUNTER — Other Ambulatory Visit: Payer: Self-pay | Admitting: Internal Medicine

## 2024-07-16 ENCOUNTER — Other Ambulatory Visit: Payer: Self-pay | Admitting: Internal Medicine

## 2024-07-31 ENCOUNTER — Encounter: Payer: BC Managed Care – PPO | Admitting: Internal Medicine
